# Patient Record
Sex: Male | Born: 1950 | Hispanic: Yes | Marital: Single
Health system: Southern US, Community
[De-identification: ages and names within clinical notes are randomized; demographics above are authoritative.]

## PROBLEM LIST (undated history)

## (undated) DIAGNOSIS — F0390 Unspecified dementia without behavioral disturbance: Secondary | ICD-10-CM

## (undated) DIAGNOSIS — I10 Essential (primary) hypertension: Secondary | ICD-10-CM

---

## 2019-01-06 ENCOUNTER — Emergency Department: Payer: Medicaid Other

## 2019-01-06 ENCOUNTER — Encounter: Payer: Self-pay | Admitting: *Deleted

## 2019-01-06 ENCOUNTER — Inpatient Hospital Stay
Admission: EM | Admit: 2019-01-06 | Discharge: 2019-01-09 | DRG: 641 | Disposition: A | Discharge status: Home Health | Payer: Medicaid Other | Attending: Internal Medicine | Admitting: Internal Medicine

## 2019-01-06 ENCOUNTER — Other Ambulatory Visit: Payer: Self-pay

## 2019-01-06 DIAGNOSIS — E860 Dehydration: Principal | ICD-10-CM | POA: Diagnosis present

## 2019-01-06 DIAGNOSIS — Z681 Body mass index (BMI) 19 or less, adult: Secondary | ICD-10-CM

## 2019-01-06 DIAGNOSIS — R4182 Altered mental status, unspecified: Secondary | ICD-10-CM | POA: Diagnosis present

## 2019-01-06 DIAGNOSIS — F0390 Unspecified dementia without behavioral disturbance: Secondary | ICD-10-CM | POA: Diagnosis present

## 2019-01-06 DIAGNOSIS — E538 Deficiency of other specified B group vitamins: Secondary | ICD-10-CM | POA: Diagnosis present

## 2019-01-06 DIAGNOSIS — E46 Unspecified protein-calorie malnutrition: Secondary | ICD-10-CM | POA: Diagnosis present

## 2019-01-06 DIAGNOSIS — Z1159 Encounter for screening for other viral diseases: Secondary | ICD-10-CM

## 2019-01-06 DIAGNOSIS — K5900 Constipation, unspecified: Secondary | ICD-10-CM | POA: Diagnosis present

## 2019-01-06 DIAGNOSIS — I951 Orthostatic hypotension: Secondary | ICD-10-CM | POA: Diagnosis present

## 2019-01-06 DIAGNOSIS — I10 Essential (primary) hypertension: Secondary | ICD-10-CM | POA: Diagnosis present

## 2019-01-06 DIAGNOSIS — D61818 Other pancytopenia: Secondary | ICD-10-CM | POA: Diagnosis present

## 2019-01-06 DIAGNOSIS — G9340 Encephalopathy, unspecified: Secondary | ICD-10-CM | POA: Diagnosis present

## 2019-01-06 DIAGNOSIS — R319 Hematuria, unspecified: Secondary | ICD-10-CM | POA: Diagnosis present

## 2019-01-06 DIAGNOSIS — E559 Vitamin D deficiency, unspecified: Secondary | ICD-10-CM | POA: Diagnosis present

## 2019-01-06 HISTORY — DX: Essential (primary) hypertension: I10

## 2019-01-06 HISTORY — DX: Unspecified dementia, unspecified severity, without behavioral disturbance, psychotic disturbance, mood disturbance, and anxiety: F03.90

## 2019-01-06 LAB — URINALYSIS, COMPLETE (UACMP) WITH MICROSCOPIC
Bilirubin Urine: NEGATIVE
Glucose, UA: NEGATIVE mg/dL
Ketones, ur: NEGATIVE mg/dL
Leukocytes,Ua: NEGATIVE
Nitrite: NEGATIVE
Protein, ur: 30 mg/dL — AB
Specific Gravity, Urine: 1.01 (ref 1.005–1.030)
Squamous Epithelial / HPF: NONE SEEN (ref 0–5)
pH: 5 (ref 5.0–8.0)

## 2019-01-06 LAB — COMPREHENSIVE METABOLIC PANEL
ALT: 79 U/L — ABNORMAL HIGH (ref 0–44)
AST: 153 U/L — ABNORMAL HIGH (ref 15–41)
Albumin: 2.5 g/dL — ABNORMAL LOW (ref 3.5–5.0)
Alkaline Phosphatase: 181 U/L — ABNORMAL HIGH (ref 38–126)
Anion gap: 7 (ref 5–15)
BUN: 19 mg/dL (ref 8–23)
CO2: 21 mmol/L — ABNORMAL LOW (ref 22–32)
Calcium: 7.7 mg/dL — ABNORMAL LOW (ref 8.9–10.3)
Chloride: 103 mmol/L (ref 98–111)
Creatinine, Ser: 0.94 mg/dL (ref 0.61–1.24)
GFR calc Af Amer: >60 mL/min (ref >60)
GFR calc non Af Amer: >60 mL/min (ref >60)
Glucose, Bld: 100 mg/dL — ABNORMAL HIGH (ref 70–99)
Potassium: 3.9 mmol/L (ref 3.5–5.1)
Sodium: 131 mmol/L — ABNORMAL LOW (ref 135–145)
Total Bilirubin: 0.4 mg/dL (ref 0.3–1.2)
Total Protein: 8.3 g/dL — ABNORMAL HIGH (ref 6.5–8.1)

## 2019-01-06 LAB — CBC
HCT: 29.5 % — ABNORMAL LOW (ref 39.0–52.0)
Hemoglobin: 9 g/dL — ABNORMAL LOW (ref 13.0–17.0)
MCH: 24.9 pg — ABNORMAL LOW (ref 26.0–34.0)
MCHC: 30.5 g/dL (ref 30.0–36.0)
MCV: 81.5 fL (ref 80.0–100.0)
Platelets: 106 10*3/uL — ABNORMAL LOW (ref 150–400)
RBC: 3.62 MIL/uL — ABNORMAL LOW (ref 4.22–5.81)
RDW: 24.2 % — ABNORMAL HIGH (ref 11.5–15.5)
WBC: 2.7 10*3/uL — ABNORMAL LOW (ref 4.0–10.5)
nRBC: 0 % (ref 0.0–0.2)

## 2019-01-06 LAB — AMMONIA: Ammonia: 26 umol/L (ref 9–35)

## 2019-01-06 LAB — LACTIC ACID, PLASMA: Lactic Acid, Venous: 1.5 mmol/L (ref 0.5–1.9)

## 2019-01-06 MED ORDER — SODIUM CHLORIDE 0.9% FLUSH: 3.0000 mL | Freq: Once | INTRAVENOUS | Status: DC

## 2019-01-06 NOTE — ED Notes (Signed)
Interpreter request made. 

## 2019-01-06 NOTE — ED Provider Notes (Signed)
[facility] Emergency Department Provider Note  Time seen: 9:14 PM  I have reviewed the triage vital signs and the nursing notes.   HISTORY  Chief Complaint Altered Mental Status   HPI Ryan Matthews is a 68 y.o. male with a past medical history of dementia, hypertension, presents to the emergency department for altered mental status.  According to EMS report the patient's pastor went to his home and thought he was confused so he called EMS to bring the patient to the emergency department.  Patient lives at home with his brother has a history of dementia.  Here the patient is awake and alert, he is disoriented to time but oriented to person and place.  Denies any complaints.  Denies any pain.  Exam performed with Spanish interpreter present.   Past Medical History:  Diagnosis Date  . Dementia (HCC)   . Hypertension     There are no active problems to display for this patient.    Prior to Admission medications   Not on File    No Known Allergies  No family history on file.  Social History Social History   Tobacco Use  . Smoking status: Not on file  Substance Use Topics  . Alcohol use: Not on file  . Drug use: Not on file    Review of Systems Unable to complete an adequate/accurate review of systems taken to baseline dementia.  ____________________________________________   PHYSICAL EXAM:  VITAL SIGNS: ED Triage Vitals  Enc Vitals Group     BP 01/06/19 2049 (!) 140/92     Pulse Rate 01/06/19 2049 86     Resp 01/06/19 2049 17     Temp 01/06/19 2049 98 F (36.7 C)     Temp Source 01/06/19 2049 Oral     SpO2 01/06/19 2049 100 %     Weight 01/06/19 2050 118 lb (53.5 kg)     Height 01/06/19 2050 5\' 8"  (1.727 m)     Head Circumference --      Peak Flow --      Pain Score 01/06/19 2050 0     Pain Loc --      Pain Edu? --      Excl. in GC? --    Constitutional: Patient is awake and alert, disoriented to time. Eyes: Normal  exam ENT      Head: Normocephalic and atraumatic.      Mouth/Throat: Mucous membranes are moist. Cardiovascular: Normal rate, regular rhythm.  Respiratory: Normal respiratory effort without tachypnea nor retractions. Breath sounds are clear  Gastrointestinal: Soft and nontender. No distention. Musculoskeletal: Nontender with normal range of motion in all extremities.  Neurologic:  No gross focal neurologic deficits  Skin:  Skin is warm, dry and intact.  Psychiatric: Mood and affect are normal.  ____________________________________________    EKG  EKG viewed and interpreted by myself shows a normal sinus rhythm at 85 bpm with a narrow QRS, slight left axis deviation, slight QTC prolongation.  Nonspecific ST changes.  ____________________________________________    RADIOLOGY  Ultrasound pending  ____________________________________________   INITIAL IMPRESSION / ASSESSMENT AND PLAN / ED COURSE  Pertinent labs & imaging results that were available during my care of the patient were reviewed by me and considered in my medical decision making (see chart for details).   Patient presents emergency department for possible altered mental status.  EMS reports possible altered mental status however patient has a reported history of dementia and has no complaints at this  time.  He is able to converse, in Spanish with the interpreter present.  He has no complaints, no concerns.  He states he was at home he ate his dinner and his brother told him he had to go with EMS to get evaluated at the hospital.  I spoke to the patient's brother Ryan Matthews, states the patient does not have baseline dementia although does have confusion at times.  States over the past few weeks he has not been eating as much, has not been sleeping very well at night.  They were concerned so they sent her to the emergency department tonight.  Differential would include infectious etiology, electrolyte abnormality, possibly  dementia as well.    Patient remains confused.  Right upper quadrant ultrasound pending.  Ammonia level pending.  Again it is unclear how much of this is acute versus chronic.  Patient care signed out to oncoming physician.   Ryan Matthews was evaluated in Emergency Department on 01/06/2019 for the symptoms described in the history of present illness. He was evaluated in the context of the global COVID-19 pandemic, which necessitated consideration that the patient might be at risk for infection with the SARS-CoV-2 virus that causes COVID-19. Institutional protocols and algorithms that pertain to the evaluation of patients at risk for COVID-19 are in a state of rapid change based on information released by regulatory bodies including the CDC and federal and state organizations. These policies and algorithms were followed during the patient's care in the ED.  ____________________________________________   FINAL CLINICAL IMPRESSION(S) / ED DIAGNOSES  Altered mental status   Minna Antis, MD 01/06/19 2256

## 2019-01-06 NOTE — ED Notes (Signed)
Pt removed all clothes and monitors and placed socks on his head. Pt was incontinent of a lg amt of urine. Bed changed, warm blankets given. Monitors left off at this time

## 2019-01-06 NOTE — ED Triage Notes (Signed)
Pt lives at home and church pastor called because pt was altered. Pt speaks Spanish unsure if he lives with any family. Pt has lost a lot of weight over past 2 weeks. History very unclear and information provided very inconsistent

## 2019-01-07 ENCOUNTER — Inpatient Hospital Stay: Payer: Medicaid Other

## 2019-01-07 ENCOUNTER — Emergency Department: Payer: Medicaid Other

## 2019-01-07 DIAGNOSIS — R4182 Altered mental status, unspecified: Secondary | ICD-10-CM | POA: Diagnosis present

## 2019-01-07 DIAGNOSIS — K5900 Constipation, unspecified: Secondary | ICD-10-CM | POA: Diagnosis present

## 2019-01-07 DIAGNOSIS — E538 Deficiency of other specified B group vitamins: Secondary | ICD-10-CM | POA: Diagnosis present

## 2019-01-07 DIAGNOSIS — E46 Unspecified protein-calorie malnutrition: Secondary | ICD-10-CM | POA: Diagnosis present

## 2019-01-07 DIAGNOSIS — E860 Dehydration: Secondary | ICD-10-CM | POA: Diagnosis not present

## 2019-01-07 DIAGNOSIS — F0390 Unspecified dementia without behavioral disturbance: Secondary | ICD-10-CM | POA: Diagnosis present

## 2019-01-07 DIAGNOSIS — R319 Hematuria, unspecified: Secondary | ICD-10-CM | POA: Diagnosis present

## 2019-01-07 DIAGNOSIS — Z681 Body mass index (BMI) 19 or less, adult: Secondary | ICD-10-CM | POA: Diagnosis not present

## 2019-01-07 DIAGNOSIS — I10 Essential (primary) hypertension: Secondary | ICD-10-CM | POA: Diagnosis present

## 2019-01-07 DIAGNOSIS — Z1159 Encounter for screening for other viral diseases: Secondary | ICD-10-CM | POA: Diagnosis not present

## 2019-01-07 DIAGNOSIS — G9340 Encephalopathy, unspecified: Secondary | ICD-10-CM | POA: Diagnosis present

## 2019-01-07 DIAGNOSIS — D61818 Other pancytopenia: Secondary | ICD-10-CM | POA: Diagnosis present

## 2019-01-07 DIAGNOSIS — E559 Vitamin D deficiency, unspecified: Secondary | ICD-10-CM | POA: Diagnosis present

## 2019-01-07 DIAGNOSIS — I951 Orthostatic hypotension: Secondary | ICD-10-CM | POA: Diagnosis present

## 2019-01-07 LAB — CBC
HCT: 27.3 % — ABNORMAL LOW (ref 39.0–52.0)
Hemoglobin: 8.2 g/dL — ABNORMAL LOW (ref 13.0–17.0)
MCH: 24.8 pg — ABNORMAL LOW (ref 26.0–34.0)
MCHC: 30 g/dL (ref 30.0–36.0)
MCV: 82.5 fL (ref 80.0–100.0)
Platelets: 100 10*3/uL — ABNORMAL LOW (ref 150–400)
RBC: 3.31 MIL/uL — ABNORMAL LOW (ref 4.22–5.81)
RDW: 24.1 % — ABNORMAL HIGH (ref 11.5–15.5)
WBC: 3 10*3/uL — ABNORMAL LOW (ref 4.0–10.5)
nRBC: 0 % (ref 0.0–0.2)

## 2019-01-07 LAB — VITAMIN B12: Vitamin B-12: 143 pg/mL — ABNORMAL LOW (ref 180–914)

## 2019-01-07 LAB — HEPATIC FUNCTION PANEL
ALT: 70 U/L — ABNORMAL HIGH (ref 0–44)
AST: 140 U/L — ABNORMAL HIGH (ref 15–41)
Albumin: 2.2 g/dL — ABNORMAL LOW (ref 3.5–5.0)
Alkaline Phosphatase: 179 U/L — ABNORMAL HIGH (ref 38–126)
Bilirubin, Direct: 0.2 mg/dL (ref 0.0–0.2)
Indirect Bilirubin: 0.4 mg/dL (ref 0.3–0.9)
Total Bilirubin: 0.6 mg/dL (ref 0.3–1.2)
Total Protein: 7.7 g/dL (ref 6.5–8.1)

## 2019-01-07 LAB — TSH
TSH: 3.276 u[IU]/mL (ref 0.350–4.500)
TSH: 3.14 u[IU]/mL (ref 0.350–4.500)

## 2019-01-07 LAB — PHOSPHORUS: Phosphorus: 4.1 mg/dL (ref 2.5–4.6)

## 2019-01-07 LAB — IRON AND TIBC
Iron: 48 ug/dL (ref 45–182)
Saturation Ratios: 16 % — ABNORMAL LOW (ref 17.9–39.5)
TIBC: 299 ug/dL (ref 250–450)
UIBC: 251 ug/dL

## 2019-01-07 LAB — BASIC METABOLIC PANEL
Anion gap: 7 (ref 5–15)
BUN: 17 mg/dL (ref 8–23)
CO2: 21 mmol/L — ABNORMAL LOW (ref 22–32)
Calcium: 7.7 mg/dL — ABNORMAL LOW (ref 8.9–10.3)
Chloride: 106 mmol/L (ref 98–111)
Creatinine, Ser: 0.9 mg/dL (ref 0.61–1.24)
GFR calc Af Amer: >60 mL/min (ref >60)
GFR calc non Af Amer: >60 mL/min (ref >60)
Glucose, Bld: 86 mg/dL (ref 70–99)
Potassium: 4 mmol/L (ref 3.5–5.1)
Sodium: 134 mmol/L — ABNORMAL LOW (ref 135–145)

## 2019-01-07 LAB — MAGNESIUM: Magnesium: 2.1 mg/dL (ref 1.7–2.4)

## 2019-01-07 LAB — FERRITIN: Ferritin: 124 ng/mL (ref 24–336)

## 2019-01-07 LAB — FOLATE: Folate: 20.9 ng/mL (ref >5.9)

## 2019-01-07 LAB — SARS CORONAVIRUS 2 BY RT PCR (HOSPITAL ORDER, PERFORMED IN ~~LOC~~ HOSPITAL LAB): SARS Coronavirus 2: NEGATIVE

## 2019-01-07 MED ORDER — SODIUM CHLORIDE 0.9 % IV SOLN
INTRAVENOUS | Status: DC
  Administered 2019-01-07: 07:00:00 via INTRAVENOUS

## 2019-01-07 MED ORDER — ENSURE ENLIVE PO LIQD
1.0000 | Freq: Three times a day (TID) | ORAL | Status: DC
  Administered 2019-01-07 – 2019-01-09 (×4): 237 mL via ORAL

## 2019-01-07 MED ORDER — SODIUM CHLORIDE 0.9 % IV SOLN
INTRAVENOUS | Status: DC
  Administered 2019-01-07 – 2019-01-08 (×2): via INTRAVENOUS

## 2019-01-07 MED ORDER — ONDANSETRON HCL 4 MG PO TABS: 4.0000 mg | ORAL_TABLET | Freq: Four times a day (QID) | ORAL | Status: DC | PRN

## 2019-01-07 MED ORDER — IOHEXOL 240 MG/ML SOLN
25.0000 mL | INTRAMUSCULAR | Status: AC
  Administered 2019-01-07 (×2): 25 mL via ORAL

## 2019-01-07 MED ORDER — SODIUM CHLORIDE 0.9% FLUSH
3.0000 mL | Freq: Two times a day (BID) | INTRAVENOUS | Status: DC
  Administered 2019-01-07 – 2019-01-09 (×4): 3 mL via INTRAVENOUS

## 2019-01-07 MED ORDER — CYANOCOBALAMIN 1000 MCG/ML IJ SOLN
1000.0000 ug | Freq: Every day | INTRAMUSCULAR | Status: DC
  Administered 2019-01-07 – 2019-01-09 (×3): 1000 ug via INTRAMUSCULAR
  Filled 2019-01-07 (×3): qty 1

## 2019-01-07 MED ORDER — VITAMIN B-1 100 MG PO TABS
100.0000 mg | ORAL_TABLET | Freq: Every day | ORAL | Status: DC
  Administered 2019-01-07 – 2019-01-09 (×3): 100 mg via ORAL
  Filled 2019-01-07 (×3): qty 1

## 2019-01-07 MED ORDER — FOLIC ACID 1 MG PO TABS
1.0000 mg | ORAL_TABLET | Freq: Every day | ORAL | Status: DC
  Administered 2019-01-07 – 2019-01-09 (×3): 1 mg via ORAL
  Filled 2019-01-07 (×3): qty 1

## 2019-01-07 MED ORDER — IOHEXOL 300 MG/ML  SOLN
75.0000 mL | Freq: Once | INTRAMUSCULAR | Status: AC | PRN
  Administered 2019-01-07: 75 mL via INTRAVENOUS

## 2019-01-07 MED ORDER — ATENOLOL 25 MG PO TABS
12.5000 mg | ORAL_TABLET | Freq: Every day | ORAL | Status: DC
  Administered 2019-01-07: 10:00:00 12.5 mg via ORAL
  Filled 2019-01-07: qty 0.5

## 2019-01-07 MED ORDER — PANTOPRAZOLE SODIUM 40 MG PO TBEC
40.0000 mg | DELAYED_RELEASE_TABLET | Freq: Every day | ORAL | Status: DC
  Administered 2019-01-07 – 2019-01-09 (×3): 40 mg via ORAL
  Filled 2019-01-07 (×3): qty 1

## 2019-01-07 MED ORDER — ONDANSETRON HCL 4 MG/2ML IJ SOLN: 4.0000 mg | Freq: Four times a day (QID) | INTRAMUSCULAR | Status: DC | PRN

## 2019-01-07 MED ORDER — ADULT MULTIVITAMIN W/MINERALS CH
1.0000 | ORAL_TABLET | Freq: Every day | ORAL | Status: DC
  Administered 2019-01-07 – 2019-01-09 (×3): 1 via ORAL
  Filled 2019-01-07 (×3): qty 1

## 2019-01-07 MED ORDER — POLYETHYLENE GLYCOL 3350 17 G PO PACK
17.0000 g | PACK | Freq: Every day | ORAL | Status: DC | PRN
  Administered 2019-01-07: 10:00:00 17 g via ORAL
  Filled 2019-01-07: qty 1

## 2019-01-07 NOTE — Progress Notes (Signed)
Family Meeting Note  Advance Directive:no  Today a meeting took place with the Patient.  Patient is able to participate.  The following clinical team members were present during this meeting:MD  The following were discussed:Patient's diagnosis: altered mental status, Patient's progosis: Unable to determine and Goals for treatment: Full Code  Additional follow-up to be provided: prn  Time spent during discussion:20 minutes  Hilton Sinclair, MD

## 2019-01-07 NOTE — Progress Notes (Signed)
Initial Nutrition Assessment  RD working remotely.  DOCUMENTATION CODES:   Underweight  INTERVENTION:  Agree with regular diet. Patient thought he was not allowed to eat at this time. Discussed he is on a regular diet and can have anything available on the menu. Called kitchen to request Spanish-speaking host/hostess to help patient with ordering.  Provide Ensure Enlive po TID, each supplement provides 350 kcal and 20 grams of protein. Patient prefers vanilla.  Continue daily MVI, thiamine 100 mg daily, folic acid 1 mg daily.  Monitor magnesium, potassium, and phosphorus daily for at least 3 days, MD to replete as needed, as pt is at risk for refeeding syndrome.  Patient would benefit from food-related community resources. Unfortunately the [employer] guide is only available in [place] - spoke with [employer] and they do not have it in [place].  NUTRITION DIAGNOSIS:   Inadequate oral intake related to decreased appetite, other (see comment)(limited access to food at home) as evidenced by per patient/family report.  GOAL:   Patient will meet greater than or equal to 90% of their needs  MONITOR:   PO intake, Supplement acceptance, Labs, Weight trends, I & O's  REASON FOR ASSESSMENT:   Consult Assessment of nutrition requirement/status  ASSESSMENT:   68 year old Spanish-speaking male with PMHx of dementia, HTN admitted with AMS, elevated LFTs, protein calorie malnutrition.   Spoke with patient over the phone with interpreter Orson Slick Laukaitis). Patient is not the best historian. He reports he is not eating well here. He thought he was not allowed to eat yet and didn't realize he was ordered for a diet. Tray is on the way from kitchen. He reports at home he was not eating well because he does not have much food. Unable to provide many more details. Encouraged adequate intake of calories and protein at meals. Patient is amenable to trying vanilla ONS to help  meet calorie/protein needs.  He reports his UBW is 130 lbs. He is unsure when he lost weight. Patient is currently 53.5 kg (118 lbs). No weight history in chart to trend. Patient is underweight.  Medications reviewed and include: folic acid 1 mg daily, MVI daily, pantoprazole, thiamine 100 mg daily, NS @ 75 mL/hr.  Labs reviewed: Sodium 134, CO2 21.  RD suspects patient is malnourished, but unable to confirm without completing NFPE.  NUTRITION - FOCUSED PHYSICAL EXAM:  Unable to complete at this time.  Diet Order:   Diet Order            Diet regular Room service appropriate? Yes; Fluid consistency: Thin  Diet effective now             EDUCATION NEEDS:   No education needs have been identified at this time  Skin:  Skin Assessment: Reviewed RN Assessment  Last BM:  01/06/2019 per chart  Height:   Ht Readings from Last 1 Encounters:  01/06/19 5\' 8"  (1.727 m)   Weight:   Wt Readings from Last 1 Encounters:  01/06/19 53.5 kg   Ideal Body Weight:  70 kg  BMI:  Body mass index is 17.94 kg/m.  Estimated Nutritional Needs:   Kcal:  1600-1800  Protein:  80-90 grams  Fluid:  1.6-1.8 L/day  Helane Rima, MS, RD, LDN Office: [phone] Pager: [phone] After Hours/Weekend Pager: [phone]

## 2019-01-07 NOTE — ED Provider Notes (Addendum)
Patient's labs and x-rays are back.  His ultrasound is negative.  His head CT is negative.  His chest x-ray is negative.  His LFTs are somewhat elevated and he has very mild pancytopenia.  We will get him in the hospital and further evaluate him for his altered mental status.  He is taking all of his clothes off and urinated on the floor.  His brother has told previous ER doctor that this is completely unlike him he is been very confused for couple days.   Arnaldo Natal, MD 01/07/19 0121 Ryan Matthews was evaluated in Emergency Department on 01/07/2019 for the symptoms described in the history of present illness. He was evaluated in the context of the global COVID-19 pandemic, which necessitated consideration that the patient might be at risk for infection with the SARS-CoV-2 virus that causes COVID-19. Institutional protocols and algorithms that pertain to the evaluation of patients at risk for COVID-19 are in a state of rapid change based on information released by regulatory bodies including the CDC and federal and state organizations. These policies and algorithms were followed during the patient's care in the ED.   Arnaldo Natal, MD 01/07/19 [phone]

## 2019-01-07 NOTE — H&P (Signed)
[facility] [place] [facility]   PATIENT NAME: Ryan Matthews    MR#:  [id number]  DATE OF BIRTH:  05/14/1951  DATE OF ADMISSION:  01/06/2019  PRIMARY CARE PHYSICIAN: Patient, No Pcp Per   REQUESTING/REFERRING PHYSICIAN: Minna Antis, MD  CHIEF COMPLAINT:   Chief Complaint  Patient presents with   Altered Mental Status    HISTORY OF PRESENT ILLNESS:  Ryan Matthews  is a 68 y.o. male with a known history of mild dementia and hypertension.  He presented to the emergency room via EMS services from home where he lives with his brother.  According to EMS report, patient's pastor had went to his home for a visit and felt that he was more confused than usual therefore called EMS services for evaluation.  According to the brother's report, patient had been confused over the last 2 days with decreased appetite and decreased p.o. intake.  He is also reported to have difficulty sleeping.  During the time I am examining the patient, he is awake and alert.  He is oriented x4.  He denies shortness of breath or chest pain.  He denies cough.  He denies fevers, chills, nausea, vomiting, diarrhea.  He denies hematemesis, hematochezia, or melena.  He denies joint pain.  He denies dizziness.  He denies unilateral weakness.  Patient reports his p.o. intake has been decreased as he was working night shift recently.  He denies EtOH.  On arrival to the emergency room, CT brain demonstrates no acute intracranial abnormality.  Chest x-ray shows no acute pulmonary disease.  Right upper quadrant ultrasound demonstrates no acute abnormality.  No evidence of cholelithiasis.  However, patient has elevated LFTs with AST 153, ALT 79, and alkaline phosphatase 181.  Patient is also mildly anemic with hemoglobin 9 and hematocrit 29.5 as well as platelet 106.  History of present illness and plan was discussed with patient using interpreter.  He has been admitted to the hospitalist service for  further evaluation and management.  PAST MEDICAL HISTORY:   Past Medical History:  Diagnosis Date   Dementia (HCC)    Hypertension     PAST SURGICAL HISTORY:  None  SOCIAL HISTORY:   Social History   Tobacco Use   Smoking status: Not on file  Substance Use Topics   Alcohol use: Not on file    FAMILY HISTORY:  No family history on file.  DRUG ALLERGIES:  No Known Allergies  REVIEW OF SYSTEMS:   Review of Systems  Constitutional: Negative for chills, fever and weight loss.  HENT: Negative for congestion, sinus pain and sore throat.   Eyes: Negative for blurred vision, double vision and pain.  Respiratory: Negative for cough, hemoptysis, sputum production and shortness of breath.   Cardiovascular: Negative for chest pain, leg swelling and PND.  Gastrointestinal: Negative for abdominal pain, blood in stool, constipation, diarrhea, heartburn, nausea and vomiting.  Genitourinary: Negative for dysuria, flank pain, frequency, hematuria and urgency.  Musculoskeletal: Negative for falls, joint pain, myalgias and neck pain.  Skin: Negative for itching and rash.  Neurological: Negative for dizziness, loss of consciousness and weakness.  Psychiatric/Behavioral: Negative.       MEDICATIONS AT HOME:   Prior to Admission medications   Not on File      VITAL SIGNS:  Blood pressure 132/88, pulse 78, temperature 98 F (36.7 C), temperature source Oral, resp. rate 14, height  (1.727 m), weight 53.5 kg, SpO2 100 %.  PHYSICAL EXAMINATION:  Physical  Exam Vitals signs and nursing note reviewed.  Constitutional:      General: He is not in acute distress. HENT:     Head: Normocephalic.     Right Ear: External ear normal.     Left Ear: External ear normal.     Nose: Nose normal.     Mouth/Throat:     Mouth: Mucous membranes are moist.     Pharynx: Oropharynx is clear.  Eyes:     General: No scleral icterus.    Conjunctiva/sclera: Conjunctivae normal.      Pupils: Pupils are equal, round, and reactive to light.  Neck:     Musculoskeletal: Normal range of motion and neck supple.  Cardiovascular:     Rate and Rhythm: Normal rate and regular rhythm.     Pulses: Normal pulses.     Heart sounds: Normal heart sounds. No murmur. No friction rub. No gallop.   Pulmonary:     Effort: Pulmonary effort is normal. No respiratory distress.     Breath sounds: Normal breath sounds. No wheezing, rhonchi or rales.  Abdominal:     General: Abdomen is flat. Bowel sounds are normal. There is no distension.     Palpations: Abdomen is soft. There is no mass.     Tenderness: There is no abdominal tenderness. There is no guarding or rebound.  Musculoskeletal: Normal range of motion.        General: No swelling or tenderness.     Right lower leg: No edema.     Left lower leg: No edema.  Skin:    General: Skin is warm and dry.     Capillary Refill: Capillary refill takes less than 2 seconds.     Findings: No rash.  Neurological:     Mental Status: He is alert.     Motor: Weakness present.     Comments: Currently oriented x4, disoriented at times       LABORATORY PANEL:   CBC Recent Labs  Lab 01/06/19 2053  WBC 2.7*  HGB 9.0*  HCT 29.5*  PLT 106*   ------------------------------------------------------------------------------------------------------------------  Chemistries  Recent Labs  Lab 01/06/19 2053  NA 131*  K 3.9  CL 103  CO2 21*  GLUCOSE 100*  BUN 19  CREATININE 0.94  CALCIUM 7.7*  AST 153*  ALT 79*  ALKPHOS 181*  BILITOT 0.4   ------------------------------------------------------------------------------------------------------------------  Cardiac Enzymes No results for input(s): TROPONINI in the last 168 hours. ------------------------------------------------------------------------------------------------------------------  RADIOLOGY:  Dg Chest 1 View  Result Date: 01/07/2019 CLINICAL DATA:  Lateral view.  Altered  mental status.  Cough. EXAM: CHEST  1 VIEW COMPARISON:  None. FINDINGS: Examination is limited by single lateral view. There is no acute cardiopulmonary process. No displaced fracture. The heart size is unchanged from prior study. IMPRESSION: No active disease. Electronically Signed   By: Katherine Mantle M.D.   On: 01/07/2019 01:07   Ct Head Wo Contrast  Result Date: 01/06/2019 CLINICAL DATA:  Encephalopathy EXAM: CT HEAD WITHOUT CONTRAST TECHNIQUE: Contiguous axial images were obtained from the base of the skull through the vertex without intravenous contrast. COMPARISON:  None. FINDINGS: Brain: No evidence of acute infarction, hemorrhage, hydrocephalus, extra-axial collection or mass lesion/mass effect. There is age related volume loss and microvascular ischemic changes. Vascular: No hyperdense vessel or unexpected calcification. Skull: Normal. Negative for fracture or focal lesion. Sinuses/Orbits: No acute finding. Other: None. IMPRESSION: No acute intracranial abnormality detected. Electronically Signed   By: Katherine Mantle M.D.   On: 01/06/2019  23:34   Dg Chest Portable 1 View  Result Date: 01/06/2019 CLINICAL DATA:  Altered mental status EXAM: PORTABLE CHEST 1 VIEW COMPARISON:  None. FINDINGS: Cardiac shadows within normal limits. The lungs are well aerated bilaterally. No focal infiltrate or sizable effusion is seen. No acute bony abnormality is noted. IMPRESSION: No active disease. Electronically Signed   By: Alcide CleverMark  Lukens M.D.   On: 01/06/2019 21:20   [place] Abdomen Limited Ruq  Result Date: 01/07/2019 CLINICAL DATA:  Elevated LFTs EXAM: ULTRASOUND ABDOMEN LIMITED RIGHT UPPER QUADRANT COMPARISON:  None. FINDINGS: Gallbladder: No gallstones or wall thickening visualized. No sonographic Murphy sign noted by sonographer. 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 Common bile duct: Diameter: 0.2 cm Liver: No focal lesion identified. Within normal limits in parenchymal echogenicity. Portal vein is  patent on color Doppler imaging with normal direction of blood flow towards the liver. IMPRESSION: No acute sonographic abnormality detected. No evidence for cholelithiasis. Electronically Signed   By: Katherine Mantlehristopher  Green M.D.   On: 01/07/2019 00:36      IMPRESSION AND PLAN:   1.  Altered mental status - Possible history of dementia - We will continue every 4 hours neurologic checks - No evidence of TIA or CVA, no unilateral weakness, slurred speech, facial drooping  2. elevated LFTs - Right upper quadrant ultrasound is negative with no acute findings - Patient denies EtOH - Repeat LFTs in the a.m. - Patient may need gastroenterology consult with further evaluation  3.  Protein calorie malnutrition - Dietitian consulted for nutrition recommendations - Vitamin supplementation - Physical therapy consulted for supportive care  4.  Hypertension - Patient tells me he takes atenolol daily.  Will restart this. - We will treat persistent hypertension expectantly  DVT and PPI prophylaxis were initiated      All the records are reviewed and case discussed with ED provider. The plan of care was discussed in details with the patient (and family). I answered all questions. The patient agreed to proceed with the above mentioned plan. Further management will depend upon hospital course.   CODE STATUS: Full code  TOTAL TIME TAKING CARE OF THIS PATIENT:45 minutes.    Milas Kocherngela H Adolfo Granieri CRNP on 01/07/2019 at 2:56 AM  Pager - [phone]  After 6pm go to [email or web address] - Social research officer, governmentpassword EPAS [email or web address]  [facility] [place] [facility]  Office  [phone]  CC: Primary care physician; Patient, No Pcp Per   Note: This dictation was prepared with Dragon dictation along with smaller phrase technology. Any transcriptional errors that result from this process are unintentional.

## 2019-01-07 NOTE — ED Notes (Signed)
ED TO INPATIENT HANDOFF REPORT  ED Nurse Name and Phone #:  Madelon Lips Name/Age/Gender Lubertha Sayres 68 y.o. male Room/Bed: ED24A/ED24A  Code Status   Code Status: Full Code  Home/SNF/Other Home Patient oriented to: self, place and situation Is this baseline? Yes   Triage Complete: Triage complete  Chief Complaint alt mental status  Triage Note Pt lives at home and church pastor called because pt was altered. Pt speaks Spanish unsure if he lives with any family. Pt has lost a lot of weight over past 2 weeks. History very unclear and information provided very inconsistent   Allergies No Known Allergies  Level of Care/Admitting Diagnosis ED Disposition    ED Disposition Condition Comment   Admit  Hospital Area: [facility] [[id number]]  Level of Care: Med-Surg [16]  Covid Evaluation: N/A  Diagnosis: Altered mental status [780.97.ICD-9-CM]  Admitting Physician: Willadean Carol DODD [[id number]]  Attending Physician: Willadean Carol DODD [[id number]]  Estimated length of stay: past midnight tomorrow  Certification:: I certify this patient will need inpatient services for at least 2 midnights  PT Class (Do Not Modify): Inpatient [101]  PT Acc Code (Do Not Modify): Private [1]       B Medical/Surgery History Past Medical History:  Diagnosis Date  . Dementia (HCC)   . Hypertension       A IV Location/Drains/Wounds Patient Lines/Drains/Airways Status   Active Line/Drains/Airways    Name:   Placement date:   Placement time:   Site:   Days:   Peripheral IV 01/06/19 Left Forearm   01/06/19    2100    Forearm   1          Intake/Output Last 24 hours No intake or output data in the 24 hours ending 01/07/19 2130  Labs/Imaging Results for orders placed or performed during the hospital encounter of 01/06/19 (from the past 48 hour(s))  Comprehensive metabolic panel     Status: Abnormal   Collection Time: 01/06/19  8:53 PM  Result Value Ref Range   Sodium 131  (L) 135 - 145 mmol/L   Potassium 3.9 3.5 - 5.1 mmol/L   Chloride 103 98 - 111 mmol/L   CO2 21 (L) 22 - 32 mmol/L   Glucose, Bld 100 (H) 70 - 99 mg/dL   BUN 19 8 - 23 mg/dL   Creatinine, Ser 8.65 0.61 - 1.24 mg/dL   Calcium 7.7 (L) 8.9 - 10.3 mg/dL   Total Protein 8.3 (H) 6.5 - 8.1 g/dL   Albumin 2.5 (L) 3.5 - 5.0 g/dL   AST 784 (H) 15 - 41 U/L   ALT 79 (H) 0 - 44 U/L   Alkaline Phosphatase 181 (H) 38 - 126 U/L   Total Bilirubin 0.4 0.3 - 1.2 mg/dL   GFR calc non Af Amer >60 >60 mL/min   GFR calc Af Amer >60 >60 mL/min   Anion gap 7 5 - 15    Comment: Performed at [facility], [address] [place], [place] [address]  CBC     Status: Abnormal   Collection Time: 01/06/19  8:53 PM  Result Value Ref Range   WBC 2.7 (L) 4.0 - 10.5 K/uL   RBC 3.62 (L) 4.22 - 5.81 MIL/uL   Hemoglobin 9.0 (L) 13.0 - 17.0 g/dL   HCT 52.8 (L) 41.3 - 24.4 %   MCV 81.5 80.0 - 100.0 fL   MCH 24.9 (L) 26.0 - 34.0 pg   MCHC 30.5 30.0 - 36.0  g/dL   RDW 08.6 (H) 76.1 - 95.0 %   Platelets 106 (L) 150 - 400 K/uL    Comment: Immature Platelet Fraction may be clinically indicated, consider ordering this additional test [id number]    nRBC 0.0 0.0 - 0.2 %    Comment: Performed at [facility], [address] [place], [place] [address]  Urinalysis, Complete w Microscopic     Status: Abnormal   Collection Time: 01/06/19  8:53 PM  Result Value Ref Range   Color, Urine YELLOW (A) YELLOW   APPearance HAZY (A) CLEAR   Specific Gravity, Urine 1.010 1.005 - 1.030   pH 5.0 5.0 - 8.0   Glucose, UA NEGATIVE NEGATIVE mg/dL   Hgb urine dipstick MODERATE (A) NEGATIVE   Bilirubin Urine NEGATIVE NEGATIVE   Ketones, ur NEGATIVE NEGATIVE mg/dL   Protein, ur 30 (A) NEGATIVE mg/dL   Nitrite NEGATIVE NEGATIVE   Leukocytes,Ua NEGATIVE NEGATIVE   RBC / HPF 0-5 0 - 5 RBC/hpf   WBC, UA 0-5 0 - 5 WBC/hpf   Bacteria, UA RARE (A) NONE SEEN   Squamous Epithelial / LPF NONE SEEN 0 - 5   Mucus PRESENT     Hyaline Casts, UA PRESENT    Granular Casts, UA PRESENT     Comment: Performed at [facility], [address] [place], [place] [address]  Ammonia     Status: None   Collection Time: 01/06/19  8:53 PM  Result Value Ref Range   Ammonia 26 9 - 35 umol/L    Comment: HEMOLYSIS AT THIS LEVEL MAY AFFECT RESULT Performed at [facility], [address] [place], [place] [address]   Lactic acid, plasma     Status: None   Collection Time: 01/06/19  9:02 PM  Result Value Ref Range   Lactic Acid, Venous 1.5 0.5 - 1.9 mmol/L    Comment: Performed at [facility], [address] [place], [place] [address]  TSH     Status: None   Collection Time: 01/07/19  1:19 AM  Result Value Ref Range   TSH 3.276 0.350 - 4.500 uIU/mL    Comment: Performed by a 3rd Generation assay with a functional sensitivity of <=0.01 uIU/mL. Performed at [facility], [address], [place], [place] [address]    Dg Chest 1 View  Result Date: 01/07/2019 CLINICAL DATA:  Lateral view.  Altered mental status.  Cough. EXAM: CHEST  1 VIEW COMPARISON:  None. FINDINGS: Examination is limited by single lateral view. There is no acute cardiopulmonary process. No displaced fracture. The heart size is unchanged from prior study. IMPRESSION: No active disease. Electronically Signed   By: Katherine Mantle M.D.   On: 01/07/2019 01:07   Ct Head Wo Contrast  Result Date: 01/06/2019 CLINICAL DATA:  Encephalopathy EXAM: CT HEAD WITHOUT CONTRAST TECHNIQUE: Contiguous axial images were obtained from the base of the skull through the vertex without intravenous contrast. COMPARISON:  None. FINDINGS: Brain: No evidence of acute infarction, hemorrhage, hydrocephalus, extra-axial collection or mass lesion/mass effect. There is age related volume loss and microvascular ischemic changes. Vascular: No hyperdense vessel or unexpected calcification. Skull: Normal. Negative for fracture or focal lesion.  Sinuses/Orbits: No acute finding. Other: None. IMPRESSION: No acute intracranial abnormality detected. Electronically Signed   By: Katherine Mantle M.D.   On: 01/06/2019 23:34   Dg Chest Portable 1 View  Result Date: 01/06/2019 CLINICAL DATA:  Altered mental status EXAM: PORTABLE CHEST 1 VIEW COMPARISON:  None. FINDINGS:  Cardiac shadows within normal limits. The lungs are well aerated bilaterally. No focal infiltrate or sizable effusion is seen. No acute bony abnormality is noted. IMPRESSION: No active disease. Electronically Signed   By: Alcide Clever M.D.   On: 01/06/2019 21:20   US Abdomen Limited Ruq  Result Date: 01/07/2019 CLINICAL DATA:  Elevated LFTs EXAM: ULTRASOUND ABDOMEN LIMITED RIGHT UPPER QUADRANT COMPARISON:  None. FINDINGS: Gallbladder: No gallstones or wall thickening visualized. No sonographic Murphy sign noted by sonographer. 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 Common bile duct: Diameter: 0.2 cm Liver: No focal lesion identified. Within normal limits in parenchymal echogenicity. Portal vein is patent on color Doppler imaging with normal direction of blood flow towards the liver. IMPRESSION: No acute sonographic abnormality detected. No evidence for cholelithiasis. Electronically Signed   By: Katherine Mantle M.D.   On: 01/07/2019 00:36    Pending Labs Unresulted Labs (From admission, onward)    Start     Ordered   01/07/19 0500  CBC  Tomorrow morning,   STAT     01/07/19 0317   01/07/19 0500  Basic metabolic panel  Tomorrow morning,   STAT     01/07/19 0317   01/07/19 0318  HIV antibody (Routine Testing)  Once,   STAT     01/07/19 0317   01/07/19 0318  TSH  Once,   STAT     01/07/19 0317   01/07/19 0318  Urine culture  Once,   STAT     01/07/19 0317   01/07/19 0318  Hepatic function panel  Once,   STAT     01/07/19 0317   01/07/19 0132  SARS Coronavirus 2 (CEPHEID - Performed in [facility] hospital lab), [facility] Order  (Asymptomatic Patients Labs)  Once,   STAT     Question:  Rule Out  Answer:  Yes   01/07/19 0131   01/07/19 0120  Folate RBC  ONCE - STAT,   STAT     01/07/19 0119   01/07/19 0119  Vitamin B12  ONCE - STAT,   STAT     01/07/19 0119          Vitals/Pain Today's Vitals   01/06/19 2130 01/06/19 2245 01/06/19 2248 01/06/19 2300  BP: 124/84   132/88  Pulse:  89  78  Resp:    14  Temp:      TempSrc:      SpO2:  100%  100%  Weight:      Height:      PainSc:   0-No pain 0-No pain    Isolation Precautions No active isolations  Medications Medications  sodium chloride flush (NS) 0.9 % injection 3 mL (has no administration in time range)  0.9 %  sodium chloride infusion (has no administration in time range)  polyethylene glycol (MIRALAX / GLYCOLAX) packet 17 g (has no administration in time range)  ondansetron (ZOFRAN) tablet 4 mg (has no administration in time range)    Or  ondansetron (ZOFRAN) injection 4 mg (has no administration in time range)  folic acid (FOLVITE) tablet 1 mg (has no administration in time range)  multivitamin with minerals tablet 1 tablet (has no administration in time range)  thiamine (VITAMIN B-1) tablet 100 mg (has no administration in time range)  pantoprazole (PROTONIX) EC tablet 40 mg (has no administration in time range)  atenolol (TENORMIN) tablet 12.5 mg (has no administration in time range)    Mobility walks with device High fall risk   Focused Assessments Neuro  Assessment Handoff:  Swallow screen pass? no code stroke sx         Neuro Assessment:   Neuro Checks:      Last Documented NIHSS Modified Score:   Has TPA been given? No If patient is a Neuro Trauma and patient is going to OR before floor call report to 4N Charge nurse: [phone] or [phone]     R Recommendations: See Admitting Provider Note  Report given to:   Additional Notes:  Spanish speaking

## 2019-01-07 NOTE — Evaluation (Signed)
Physical Therapy Evaluation Patient Details Name: Ryan SayresSergio Matthews MRN: [id number] DOB: 05/14/1951 Today's Date: 01/07/2019   History of Present Illness  Per MD H&P: Pt is a 68 y.o. male with a known history of mild dementia and hypertension.  He presented to the emergency room via EMS services from home where he lives with his brother.  According to EMS report, patient's pastor had went to his home for a visit and felt that he was more confused than usual therefore called EMS services for evaluation.  According to the brother's report, patient had been confused over the last 2 days with decreased appetite and decreased p.o. intake.  He also reported difficulty sleeping.   Patient reported his p.o. intake has been decreased as he was working night shift recently.  CT of brain demonstrated no acute intracranial abnormality.  Chest x-ray showed no acute pulmonary disease.  Right upper quadrant ultrasound demonstrated no acute abnormality.  No evidence of cholelithiasis.  However, patient had elevated LFTs with AST 153, ALT 79, and alkaline phosphatase 181.  Patient was also mildly anemic.    Clinical Impression  Spanish interpreter Ryan HaringLoyda Matthews assisted during the session.  Pt presents with mild deficits in strength, transfers, mobility, gait, balance, and activity tolerance.  Pt required extra time and effort with bed mobility tasks but no physical assistance.  Pt was able to stand and sit with some effort but without assist.  Pt was able to amb 7080' with a RW and CGA with min instability/drifting and reported feeling that he required use of a RW at this time even though he does not use one at baseline.  Overall pt demonstrates a loss of functional strength and activity tolerance compared to his stated baseline and will benefit from HHPT services upon discharge to safely address above deficits for decreased caregiver assistance and eventual return to PLOF.      Follow Up Recommendations Home health  PT;Supervision for mobility/OOB    Equipment Recommendations  Rolling walker with 5" wheels    Recommendations for Other Services       Precautions / Restrictions Precautions Precautions: Fall Restrictions Weight Bearing Restrictions: No      Mobility  Bed Mobility Overal bed mobility: Modified Independent             General bed mobility comments: Extra time and effort required for bed mobility tasks   Transfers Overall transfer level: Needs assistance Equipment used: Rolling walker (2 wheeled) Transfers: Sit to/from Stand Sit to Stand: Min guard         General transfer comment: Fair eccentric and concentric control during transfers  Ambulation/Gait Ambulation/Gait assistance: Min guard Gait Distance (Feet): 80 Feet Assistive device: Rolling walker (2 wheeled) Gait Pattern/deviations: Step-through pattern;Decreased step length - right;Decreased step length - left;Drifts right/left Gait velocity: decreased   General Gait Details: Min instability/drifting during amb but pt able to self correct without assistance; pt reported feeling safer with the RW during amb  Stairs            Wheelchair Mobility    Modified Rankin (Stroke Patients Only)       Balance Overall balance assessment: Mild deficits observed, not formally tested                                           Pertinent Vitals/Pain Pain Assessment: No/denies pain    Home Living Family/patient  expects to be discharged to:: Private residence(History obtained with assistance from spanish interpreter Ryan Matthews) Living Arrangements: Other relatives(Brother) Available Help at Discharge: Family;Available 24 hours/day Type of Home: House Home Access: Stairs to enter Entrance Stairs-Rails: Right;Left;Can reach both Entrance Stairs-Number of Steps: 5 Home Layout: One level Home Equipment: None      Prior Function Level of Independence: Independent         Comments:  Pt Ind with amb limited community distances with no fall history, Ind with ADLs     Hand Dominance        Extremity/Trunk Assessment   Upper Extremity Assessment Upper Extremity Assessment: Generalized weakness    Lower Extremity Assessment Lower Extremity Assessment: Generalized weakness       Communication   Communication: Interpreter utilized  Cognition Arousal/Alertness: Awake/alert Behavior During Therapy: WFL for tasks assessed/performed Overall Cognitive Status: Within Functional Limits for tasks assessed                                        General Comments      Exercises     Assessment/Plan    PT Assessment Patient needs continued PT services  PT Problem List Decreased strength;Decreased balance;Decreased activity tolerance       PT Treatment Interventions DME instruction;Gait training;Stair training;Functional mobility training;Therapeutic activities;Therapeutic exercise;Balance training;Patient/family education    PT Goals (Current goals can be found in the Care Plan section)  Acute Rehab PT Goals Patient Stated Goal: To get stronger PT Goal Formulation: With patient Time For Goal Achievement: 01/20/19 Potential to Achieve Goals: Good    Frequency Min 2X/week   Barriers to discharge        Co-evaluation               AM-PAC PT "6 Clicks" Mobility  Outcome Measure Help needed turning from your back to your side while in a flat bed without using bedrails?: A Little Help needed moving from lying on your back to sitting on the side of a flat bed without using bedrails?: A Little Help needed moving to and from a bed to a chair (including a wheelchair)?: A Little Help needed standing up from a chair using your arms (e.g., wheelchair or bedside chair)?: A Little Help needed to walk in hospital room?: A Little Help needed climbing 3-5 steps with a railing? : A Little 6 Click Score: 18    End of Session Equipment Utilized  During Treatment: Gait belt Activity Tolerance: Patient tolerated treatment well Patient left: in bed;with bed alarm set;with call bell/phone within reach;Other (comment)(MD at bedside at end of session) Nurse Communication: Mobility status PT Visit Diagnosis: Unsteadiness on feet (R26.81);Muscle weakness (generalized) (M62.81);Difficulty in walking, not elsewhere classified (R26.2)    Time: 3016-0109 PT Time Calculation (min) (ACUTE ONLY): 27 min   Charges:   PT Evaluation $PT Eval Low Complexity: 1 Low          D. Scott Bemnet Trovato PT, DPT 01/07/19, 11:21 AM

## 2019-01-07 NOTE — Progress Notes (Signed)
Patient ID: Ryan Matthews, male   DOB: 05/14/1951, 68 y.o.   MRN: [id number]  [facility] PROGRESS NOTE  Ryan Matthews [id number]:[id number] DOB: 05/14/1951 DOA: 01/06/2019 PCP: Patient, No Pcp Per  HPI/Subjective: Patient does not know why he is here.  He states he feels okay.  Spoke with brother on the phone states that patient has been getting dizzy and not eating very much and he is forgetting to take his medications.  Objective: Vitals:   01/07/19 1049 01/07/19 1051  BP: (!) 86/68 121/83  Pulse: (!) 111 82  Resp: 20 20  Temp: 98.1 F (36.7 C) 98.1 F (36.7 C)  SpO2: 100% 100%    Intake/Output Summary (Last 24 hours) at 01/07/2019 1535 Last data filed at 01/07/2019 1500 Gross per 24 hour  Intake 1212 ml  Output -  Net 1212 ml   Filed Weights   01/06/19 2050  Weight: 53.5 kg    ROS: Review of Systems  Constitutional: Negative for chills and fever.  Eyes: Negative for blurred vision.  Respiratory: Negative for cough and shortness of breath.   Cardiovascular: Negative for chest pain.  Gastrointestinal: Negative for abdominal pain, constipation, diarrhea, nausea and vomiting.  Genitourinary: Negative for dysuria.  Musculoskeletal: Negative for joint pain.  Neurological: Negative for dizziness and headaches.   Exam: Physical Exam  Constitutional: He is oriented to person, place, and time.  HENT:  Nose: No mucosal edema.  Mouth/Throat: No oropharyngeal exudate or posterior oropharyngeal edema.  Eyes: Pupils are equal, round, and reactive to light. Conjunctivae, EOM and lids are normal.  Neck: No JVD present. Carotid bruit is not present. No edema present. No thyroid mass and no thyromegaly present.  Cardiovascular: S1 normal and S2 normal. Exam reveals no gallop.  No murmur heard. Pulses:      Dorsalis pedis pulses are 2+ on the right side and 2+ on the left side.  Respiratory: No respiratory distress. He has no wheezes. He has no rhonchi. He has no rales.  GI:  Soft. Bowel sounds are normal. There is no abdominal tenderness.  Musculoskeletal:     Right ankle: He exhibits no swelling.     Left ankle: He exhibits no swelling.  Lymphadenopathy:    He has no cervical adenopathy.  Neurological: He is alert and oriented to person, place, and time. No cranial nerve deficit.  Skin: Skin is warm. Nails show no clubbing.  Chronic lower extremity skin discoloration.  Looks like a large mole on the left arm.  Psychiatric: He has a normal mood and affect.      Data Reviewed: Basic Metabolic Panel: Recent Labs  Lab 01/06/19 2053 01/07/19 0443 01/07/19 0615  NA 131* 134*  --   K 3.9 4.0  --   CL 103 106  --   CO2 21* 21*  --   GLUCOSE 100* 86  --   BUN 19 17  --   CREATININE 0.94 0.90  --   CALCIUM 7.7* 7.7*  --   MG  --   --  2.1  PHOS  --   --  4.1   Liver Function Tests: Recent Labs  Lab 01/06/19 2053 01/07/19 0443  AST 153* 140*  ALT 79* 70*  ALKPHOS 181* 179*  BILITOT 0.4 0.6  PROT 8.3* 7.7  ALBUMIN 2.5* 2.2*    Recent Labs  Lab 01/06/19 2053  AMMONIA 26   CBC: Recent Labs  Lab 01/06/19 2053 01/07/19 0443  WBC 2.7* 3.0*  HGB 9.0* 8.2*  HCT 29.5* 27.3*  MCV 81.5 82.5  PLT 106* 100*     Recent Results (from the past 240 hour(s))  SARS Coronavirus 2 (CEPHEID - Performed in [facility] lab), Hosp Order     Status: None   Collection Time: 01/07/19  2:17 AM  Result Value Ref Range Status   SARS Coronavirus 2 NEGATIVE NEGATIVE Final    Comment: (NOTE) If result is NEGATIVE SARS-CoV-2 target nucleic acids are NOT DETECTED. The SARS-CoV-2 RNA is generally detectable in upper and lower  respiratory specimens during the acute phase of infection. The lowest  concentration of SARS-CoV-2 viral copies this assay can detect is 250  copies / mL. A negative result does not preclude SARS-CoV-2 infection  and should not be used as the sole basis for treatment or other  patient management decisions.  A negative result  may occur with  improper specimen collection / handling, submission of specimen other  than nasopharyngeal swab, presence of viral mutation(s) within the  areas targeted by this assay, and inadequate number of viral copies  (<250 copies / mL). A negative result must be combined with clinical  observations, patient history, and epidemiological information. If result is POSITIVE SARS-CoV-2 target nucleic acids are DETECTED. The SARS-CoV-2 RNA is generally detectable in upper and lower  respiratory specimens dur ing the acute phase of infection.  Positive  results are indicative of active infection with SARS-CoV-2.  Clinical  correlation with patient history and other diagnostic information is  necessary to determine patient infection status.  Positive results do  not rule out bacterial infection or co-infection with other viruses. If result is PRESUMPTIVE POSTIVE SARS-CoV-2 nucleic acids MAY BE PRESENT.   A presumptive positive result was obtained on the submitted specimen  and confirmed on repeat testing.  While 2019 novel coronavirus  (SARS-CoV-2) nucleic acids may be present in the submitted sample  additional confirmatory testing may be necessary for epidemiological  and / or clinical management purposes  to differentiate between  SARS-CoV-2 and other Sarbecovirus currently known to infect humans.  If clinically indicated additional testing with an alternate test  methodology [phone]) is advised. The SARS-CoV-2 RNA is generally  detectable in upper and lower respiratory sp ecimens during the acute  phase of infection. The expected result is Negative. Fact Sheet for Patients:  BoilerBrush.com.cyhttps://www.fda.gov/media/136312/download Fact Sheet for Healthcare Providers: https://pope.com/https://www.fda.gov/media/136313/download This test is not yet approved or cleared by the [place] FDA and has been authorized for detection and/or diagnosis of SARS-CoV-2 by FDA under an Emergency Use Authorization (EUA).   This EUA will remain in effect (meaning this test can be used) for the duration of the COVID-19 declaration under Section 564(b)(1) of the Act, 21 U.S.C. section 360bbb-3(b)(1), unless the authorization is terminated or revoked sooner. Performed at [facility], [address] [place], [place] [id number]      Studies: Dg Chest 1 View  Result Date: 01/07/2019 CLINICAL DATA:  Lateral view.  Altered mental status.  Cough. EXAM: CHEST  1 VIEW COMPARISON:  None. FINDINGS: Examination is limited by single lateral view. There is no acute cardiopulmonary process. No displaced fracture. The heart size is unchanged from prior study. IMPRESSION: No active disease. Electronically Signed   By: Katherine Mantlehristopher  Green M.D.   On: 01/07/2019 01:07   Ct Head Wo Contrast  Result Date: 01/06/2019 CLINICAL DATA:  Encephalopathy EXAM: CT HEAD WITHOUT CONTRAST TECHNIQUE: Contiguous axial images were obtained from the base of the skull through the vertex without intravenous  contrast. COMPARISON:  None. FINDINGS: Brain: No evidence of acute infarction, hemorrhage, hydrocephalus, extra-axial collection or mass lesion/mass effect. There is age related volume loss and microvascular ischemic changes. Vascular: No hyperdense vessel or unexpected calcification. Skull: Normal. Negative for fracture or focal lesion. Sinuses/Orbits: No acute finding. Other: None. IMPRESSION: No acute intracranial abnormality detected. Electronically Signed   By: Katherine Mantle M.D.   On: 01/06/2019 23:34   Dg Chest Portable 1 View  Result Date: 01/06/2019 CLINICAL DATA:  Altered mental status EXAM: PORTABLE CHEST 1 VIEW COMPARISON:  None. FINDINGS: Cardiac shadows within normal limits. The lungs are well aerated bilaterally. No focal infiltrate or sizable effusion is seen. No acute bony abnormality is noted. IMPRESSION: No active disease. Electronically Signed   By: Alcide Clever M.D.   On: 01/06/2019 21:20   US Abdomen Limited  Ruq  Result Date: 01/07/2019 CLINICAL DATA:  Elevated LFTs EXAM: ULTRASOUND ABDOMEN LIMITED RIGHT UPPER QUADRANT COMPARISON:  None. FINDINGS: Gallbladder: No gallstones or wall thickening visualized. No sonographic Murphy sign noted by sonographer. 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 Common bile duct: Diameter: 0.2 cm Liver: No focal lesion identified. Within normal limits in parenchymal echogenicity. Portal vein is patent on color Doppler imaging with normal direction of blood flow towards the liver. IMPRESSION: No acute sonographic abnormality detected. No evidence for cholelithiasis. Electronically Signed   By: Katherine Mantle M.D.   On: 01/07/2019 00:36    Scheduled Meds: . cyanocobalamin  1,000 mcg Intramuscular Daily  . feeding supplement (ENSURE ENLIVE)  1 Bottle Oral TID BM  . folic acid  1 mg Oral Daily  . multivitamin with minerals  1 tablet Oral Daily  . pantoprazole  40 mg Oral Daily  . sodium chloride flush  3 mL Intravenous Q12H  . thiamine  100 mg Oral Daily   Continuous Infusions: . sodium chloride 50 mL/hr at 01/07/19 1200    Assessment/Plan:  1. Acute encephalopathy.  This has improved.  Patient answering questions through interpreter. 2. Pancytopenia.  Likely will need follow-up with hematology as outpatient.  Guaiac stools for his anemia.  If hemoglobin drops down further may end up needing a blood transfusion. 3. B12 deficiency.  Will give IM B12 shots while here in the hospital and oral B12 upon getting out of the hospital.  Will need probably B12 injections on a monthly basis. 4. Elevated liver function test.  We will get a CT scan of the abdomen. 5. Protein calorie malnutrition. 6. Orthostatic hypotension.  Hold atenolol and give IV fluids.  Code Status:     Code Status Orders  (From admission, onward)         Start     Ordered   01/07/19 0318  Full code  Continuous     01/07/19 0317        Code Status History    This patient has a current code  status but no historical code status.     Family Communication: Spoke with brother on the phone Disposition Plan: To be determined based on clinical course  Time spent: 36 minutes.  Patient seen and examined with translator and along with physical therapy and nursing staff.  Spoke with brother on the phone.  Tateanna Bach [employer]

## 2019-01-08 LAB — CBC WITH DIFFERENTIAL/PLATELET
Abs Immature Granulocytes: 0.02 10*3/uL (ref 0.00–0.07)
Basophils Absolute: 0 10*3/uL (ref 0.0–0.1)
Basophils Relative: 0 %
Eosinophils Absolute: 0 10*3/uL (ref 0.0–0.5)
Eosinophils Relative: 0 %
HCT: 24.2 % — ABNORMAL LOW (ref 39.0–52.0)
Hemoglobin: 7.2 g/dL — ABNORMAL LOW (ref 13.0–17.0)
Immature Granulocytes: 1 %
Lymphocytes Relative: 17 %
Lymphs Abs: 0.6 10*3/uL — ABNORMAL LOW (ref 0.7–4.0)
MCH: 25 pg — ABNORMAL LOW (ref 26.0–34.0)
MCHC: 29.8 g/dL — ABNORMAL LOW (ref 30.0–36.0)
MCV: 84 fL (ref 80.0–100.0)
Monocytes Absolute: 0.3 10*3/uL (ref 0.1–1.0)
Monocytes Relative: 8 %
Neutro Abs: 2.4 10*3/uL (ref 1.7–7.7)
Neutrophils Relative %: 74 %
Platelets: 92 10*3/uL — ABNORMAL LOW (ref 150–400)
RBC: 2.88 MIL/uL — ABNORMAL LOW (ref 4.22–5.81)
RDW: 24.4 % — ABNORMAL HIGH (ref 11.5–15.5)
Smear Review: NORMAL
WBC: 3.3 10*3/uL — ABNORMAL LOW (ref 4.0–10.5)
nRBC: 0 % (ref 0.0–0.2)

## 2019-01-08 LAB — RETICULOCYTES
Immature Retic Fract: 21.1 % — ABNORMAL HIGH (ref 2.3–15.9)
RBC.: 2.95 MIL/uL — ABNORMAL LOW (ref 4.22–5.81)
Retic Count, Absolute: 20.9 10*3/uL (ref 19.0–186.0)
Retic Ct Pct: 0.7 % (ref 0.4–3.1)

## 2019-01-08 LAB — HEPATITIS PANEL, ACUTE
HCV Ab: <0.1 s/co ratio (ref 0.0–0.9)
Hep A IgM: NEGATIVE
Hep B C IgM: NEGATIVE
Hepatitis B Surface Ag: NEGATIVE

## 2019-01-08 LAB — URINE CULTURE: Culture: NO GROWTH

## 2019-01-08 LAB — CK: Total CK: 94 U/L (ref 49–397)

## 2019-01-08 LAB — OCCULT BLOOD X 1 CARD TO LAB, STOOL: Fecal Occult Bld: POSITIVE — AB

## 2019-01-08 LAB — FOLATE RBC
Folate, Hemolysate: 309 ng/mL
Folate, RBC: 1221 ng/mL (ref >498)
Hematocrit: 25.3 % — ABNORMAL LOW (ref 37.5–51.0)

## 2019-01-08 LAB — PROTIME-INR
INR: 1.2 (ref 0.8–1.2)
Prothrombin Time: 14.6 seconds (ref 11.4–15.2)

## 2019-01-08 LAB — HIV ANTIBODY (ROUTINE TESTING W REFLEX): HIV Screen 4th Generation wRfx: NONREACTIVE

## 2019-01-08 LAB — PATHOLOGIST SMEAR REVIEW

## 2019-01-08 LAB — GAMMA GT: GGT: 302 U/L — ABNORMAL HIGH (ref 7–50)

## 2019-01-08 NOTE — Progress Notes (Signed)
Patient ID: Ryan Matthews, male   DOB: 05/14/1951, 68 y.o.   MRN: [id number]  [facility] PROGRESS NOTE  Ryan Matthews [id number]:[id number] DOB: 05/14/1951 DOA: 01/06/2019 PCP: Patient, No Pcp Per  HPI/Subjective: Patient feels okay.  Still feels little weak.  Still little unsteady.  Had bowel movements after CAT scan yesterday.  Objective: Vitals:   01/08/19 0559 01/08/19 1000  BP:  116/77  Pulse: 72 83  Resp:  18  Temp:  97.6 F (36.4 C)  SpO2:  100%    Filed Weights   01/06/19 2050  Weight: 53.5 kg    ROS: Review of Systems  Constitutional: Positive for malaise/fatigue. Negative for chills and fever.  Eyes: Negative for blurred vision.  Respiratory: Negative for cough and shortness of breath.   Cardiovascular: Negative for chest pain.  Gastrointestinal: Negative for abdominal pain, constipation, diarrhea, nausea and vomiting.  Genitourinary: Negative for dysuria.  Musculoskeletal: Negative for joint pain.  Neurological: Negative for dizziness and headaches.   Exam: Physical Exam  Constitutional: He is oriented to person, place, and time.  HENT:  Nose: No mucosal edema.  Mouth/Throat: No oropharyngeal exudate or posterior oropharyngeal edema.  Eyes: Pupils are equal, round, and reactive to light. Conjunctivae, EOM and lids are normal.  Neck: No JVD present. Carotid bruit is not present. No edema present. No thyroid mass and no thyromegaly present.  Cardiovascular: S1 normal and S2 normal. Exam reveals no gallop.  No murmur heard. Pulses:      Dorsalis pedis pulses are 2+ on the right side and 2+ on the left side.  Respiratory: No respiratory distress. He has no wheezes. He has no rhonchi. He has no rales.  GI: Soft. Bowel sounds are normal. There is no abdominal tenderness.  Musculoskeletal:     Right ankle: He exhibits no swelling.     Left ankle: He exhibits no swelling.  Lymphadenopathy:    He has no cervical adenopathy.  Neurological: He is alert and  oriented to person, place, and time. No cranial nerve deficit.  Skin: Skin is warm. Nails show no clubbing.  Chronic lower extremity skin discoloration.  Looks like a large mole on the left arm.  Fingertips some blackish areas.  Patient states that this happens in the winter and gets better in the summer.  Psychiatric: He has a normal mood and affect.      Data Reviewed: Basic Metabolic Panel: Recent Labs  Lab 01/06/19 2053 01/07/19 0443 01/07/19 0615  NA 131* 134*  --   K 3.9 4.0  --   CL 103 106  --   CO2 21* 21*  --   GLUCOSE 100* 86  --   BUN 19 17  --   CREATININE 0.94 0.90  --   CALCIUM 7.7* 7.7*  --   MG  --   --  2.1  PHOS  --   --  4.1   Liver Function Tests: Recent Labs  Lab 01/06/19 2053 01/07/19 0443  AST 153* 140*  ALT 79* 70*  ALKPHOS 181* 179*  BILITOT 0.4 0.6  PROT 8.3* 7.7  ALBUMIN 2.5* 2.2*    Recent Labs  Lab 01/06/19 2053  AMMONIA 26   CBC: Recent Labs  Lab 01/06/19 2053 01/07/19 0443 01/08/19 0407  WBC 2.7* 3.0* 3.3*  NEUTROABS  --   --  2.4  HGB 9.0* 8.2* 7.2*  HCT 29.5* 27.3* 24.2*  MCV 81.5 82.5 84.0  PLT 106* 100* 92*     Recent Results (from  the past 240 hour(s))  Urine culture     Status: None   Collection Time: 01/06/19  8:53 PM  Result Value Ref Range Status   Specimen Description   Final    URINE, CLEAN CATCH Performed at [facility], [address], [place], [place] [address]    Special Requests   Final    NONE Performed at [facility], [address], [place], [place] [address]    Culture   Final    NO GROWTH Performed at [facility], [address], [place], [place] [address]    Report Status 01/08/2019 FINAL  Final  SARS Coronavirus 2 (CEPHEID - Performed in [facility] hospital lab), Hosp Order     Status: None   Collection Time: 01/07/19  2:17 AM  Result Value Ref Range Status   SARS Coronavirus 2 NEGATIVE NEGATIVE Final    Comment: (NOTE) If result is NEGATIVE SARS-CoV-2  target nucleic acids are NOT DETECTED. The SARS-CoV-2 RNA is generally detectable in upper and lower  respiratory specimens during the acute phase of infection. The lowest  concentration of SARS-CoV-2 viral copies this assay can detect is 250  copies / mL. A negative result does not preclude SARS-CoV-2 infection  and should not be used as the sole basis for treatment or other  patient management decisions.  A negative result may occur with  improper specimen collection / handling, submission of specimen other  than nasopharyngeal swab, presence of viral mutation(s) within the  areas targeted by this assay, and inadequate number of viral copies  (<250 copies / mL). A negative result must be combined with clinical  observations, patient history, and epidemiological information. If result is POSITIVE SARS-CoV-2 target nucleic acids are DETECTED. The SARS-CoV-2 RNA is generally detectable in upper and lower  respiratory specimens dur ing the acute phase of infection.  Positive  results are indicative of active infection with SARS-CoV-2.  Clinical  correlation with patient history and other diagnostic information is  necessary to determine patient infection status.  Positive results do  not rule out bacterial infection or co-infection with other viruses. If result is PRESUMPTIVE POSTIVE SARS-CoV-2 nucleic acids MAY BE PRESENT.   A presumptive positive result was obtained on the submitted specimen  and confirmed on repeat testing.  While 2019 novel coronavirus  (SARS-CoV-2) nucleic acids may be present in the submitted sample  additional confirmatory testing may be necessary for epidemiological  and / or clinical management purposes  to differentiate between  SARS-CoV-2 and other Sarbecovirus currently known to infect humans.  If clinically indicated additional testing with an alternate test  methodology [phone]) is advised. The SARS-CoV-2 RNA is generally  detectable in upper and lower  respiratory sp ecimens during the acute  phase of infection. The expected result is Negative. Fact Sheet for Patients:  BoilerBrush.com.cy Fact Sheet for Healthcare Providers: https://pope.com/ This test is not yet approved or cleared by the [place] FDA and has been authorized for detection and/or diagnosis of SARS-CoV-2 by FDA under an Emergency Use Authorization (EUA).  This EUA will remain in effect (meaning this test can be used) for the duration of the COVID-19 declaration under Section 564(b)(1) of the Act, 21 U.S.C. section 360bbb-3(b)(1), unless the authorization is terminated or revoked sooner. Performed at [facility], [address], [place], [place] [address]      Studies: Dg Chest 1 View  Result Date: 01/07/2019 CLINICAL DATA:  Lateral view.  Altered mental status.  Cough. EXAM: CHEST  1 VIEW COMPARISON:  None. FINDINGS: Examination is limited by single lateral view. There is no acute cardiopulmonary process. No displaced fracture. The heart size is unchanged from prior study. IMPRESSION: No active disease. Electronically Signed   By: Katherine Mantle M.D.   On: 01/07/2019 01:07   Ct Head Wo Contrast  Result Date: 01/06/2019 CLINICAL DATA:  Encephalopathy EXAM: CT HEAD WITHOUT CONTRAST TECHNIQUE: Contiguous axial images were obtained from the base of the skull through the vertex without intravenous contrast. COMPARISON:  None. FINDINGS: Brain: No evidence of acute infarction, hemorrhage, hydrocephalus, extra-axial collection or mass lesion/mass effect. There is age related volume loss and microvascular ischemic changes. Vascular: No hyperdense vessel or unexpected calcification. Skull: Normal. Negative for fracture or focal lesion. Sinuses/Orbits: No acute finding. Other: None. IMPRESSION: No acute intracranial abnormality detected. Electronically Signed   By: Katherine Mantle M.D.   On: 01/06/2019 23:34   Ct  Abdomen Pelvis W Contrast  Result Date: 01/07/2019 CLINICAL DATA:  Altered mental status. EXAM: CT ABDOMEN AND PELVIS WITH CONTRAST TECHNIQUE: Multidetector CT imaging of the abdomen and pelvis was performed using the standard protocol following bolus administration of intravenous contrast. CONTRAST:  76mL OMNIPAQUE IOHEXOL 300 MG/ML  SOLN COMPARISON:  Ultrasound 01/06/2019 FINDINGS: Lower chest: Lung bases are clear. Hepatobiliary: No focal hepatic lesion. No biliary duct dilatation. Gallbladder is normal. Common bile duct is normal. Pancreas: Pancreas is normal. No ductal dilatation. No pancreatic inflammation. Spleen: Normal spleen Adrenals/urinary tract: Adrenal glands and kidneys are normal. The ureters and bladder normal. Stomach/Bowel: Stomach duodenum normal. Small bowel normal. Appendix normal. Moderate volume stool in the ascending, transverse and descending colon moderate volume stool in the sigmoid colon rectum. No obstructing lesion identified. Vascular/Lymphatic: Abdominal aorta is normal caliber with atherosclerotic calcification. There is no retroperitoneal or periportal lymphadenopathy. No pelvic lymphadenopathy. Reproductive: Prostate normal Other: No free fluid or abscess in the abdomen pelvis. Musculoskeletal: Bilateral pars defects at L5 with grade 1 anterolisthesis of L5 on S1. IMPRESSION: 1. Moderate volume stool throughout the colon suggests constipation. 2. No evidence of abdominal infection or inflammation. 3. 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   On: 01/07/2019 16:39   Dg Chest Portable 1 View  Result Date: 01/06/2019 CLINICAL DATA:  Altered mental status EXAM: PORTABLE CHEST 1 VIEW COMPARISON:  None. FINDINGS: Cardiac shadows within normal limits. The lungs are well aerated bilaterally. No focal infiltrate or sizable effusion is seen. No acute bony abnormality is noted. IMPRESSION: No active disease.  Electronically Signed   By: Alcide Clever M.D.   On: 01/06/2019 21:20   US Abdomen Limited Ruq  Result Date: 01/07/2019 CLINICAL DATA:  Elevated LFTs EXAM: ULTRASOUND ABDOMEN LIMITED RIGHT UPPER QUADRANT COMPARISON:  None. FINDINGS: Gallbladder: No gallstones or wall thickening visualized. No sonographic Murphy sign noted by sonographer. 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 Common bile duct: Diameter: 0.2 cm Liver: No focal lesion identified. Within normal limits in parenchymal echogenicity. Portal vein is patent on color Doppler imaging with normal direction of blood flow towards the liver. IMPRESSION: No acute sonographic abnormality detected. No evidence for cholelithiasis. Electronically Signed   By: Katherine Mantle M.D.   On: 01/07/2019 00:36    Scheduled Meds: . cyanocobalamin  1,000 mcg Intramuscular Daily  . feeding supplement (ENSURE ENLIVE)  1 Bottle Oral TID BM  . folic acid  1 mg Oral Daily  . multivitamin with minerals  1 tablet  Oral Daily  . pantoprazole  40 mg Oral Daily  . sodium chloride flush  3 mL Intravenous Q12H  . thiamine  100 mg Oral Daily   Continuous Infusions:   Assessment/Plan:  1. Pancytopenia.  Guaiac stools sent this morning.  If hemoglobin drops down further may end up needing a blood transfusion.  Sent off a type and cross.  Consulted hematology.  Consulted gastroenterology to see if they wanted to do endoscopy and colonoscopy. 2. B12 deficiency.  Will give IM B12 shots while here in the hospital and oral B12 upon getting out of the hospital.  Will need probably B12 injections on a monthly basis. 3. Elevated liver function test.  Abdominal ultrasound negative.  CT scan abdomen negative.  Hepatitis profiles negative. 4. Protein calorie malnutrition. 5. Orthostatic hypotension.  Hold atenolol.  TED hose.  Stop IV fluids today.  Code Status:     Code Status Orders  (From admission, onward)         Start     Ordered   01/07/19 0318  Full code   Continuous     01/07/19 0317        Code Status History    This patient has a current code status but no historical code status.     Family Communication: Spoke with brother on the phone yesterday.  Will call in the afternoon today after his specialist see. Disposition Plan: To be determined based on clinical course  Time spent: 27 minutes.  Case discussed with gastroenterology and I also messaged oncology.  Translator present with history and physical.  Alford Highlandichard Aiysha Jillson  [employer]

## 2019-01-08 NOTE — Consult Note (Signed)
Wyline MoodKiran Jaelene Garciagarcia , MD [address], [address], [place], [place], [id number] [address], [address], [place], [place], [address] Phone: [phone]  Fax: [phone]  Consultation  Referring Provider: Dr Renae GlossWieting Primary Care Physician:  Patient, No Pcp Per Primary Gastroenterologist:  None         Reason for Consultation:     Anemia   Date of Admission:  01/06/2019 Date of Consultation:  01/08/2019         HPI:   Ryan Matthews is a 68 y.o. male was admitted on 01/07/2019 with altered mental status.  Apparently the confusion began 2 days prior.  In the emergency room he had a CT scan of the head which showed no acute abnormalities.  LFTs were elevated and found to be anemic with a hemoglobin of 9 g and MCV of 81.5.  His other cell counts were also low with a platelet count of 106 white cell count of 2.7.  No prior labs available to compare with.  Albumin low at 2.5.  Urine shows moderate blood in the urine.  TSH normal.  Folate low at 143.  COVID negative HIV negative.  Acute hepatitis panel negative.  Ferritin normal at 124.,  Folate normal at 20.  Iron studies showed normal TIBC.  Magnesium 2.1.  Right upper quadrant ultrasound shows no abnormalities.  CT scan of the abdomen shows moderate volume of stool throughout the colon.  The patient denies any rectal bleeding/hematemesis/weight loss. Says appetite is good . No other issues.     Past Medical History:  Diagnosis Date   Dementia (HCC)    Hypertension       Prior to Admission medications   Not on File    No family history on file.   Social History   Tobacco Use   Smoking status: Not on file  Substance Use Topics   Alcohol use: Not on file   Drug use: Not on file    Allergies as of 01/06/2019   (No Known Allergies)    Review of Systems:    All systems reviewed and negative except where noted in HPI.   Physical Exam:  Vital signs in last 24 hours: Temp:  [97.6 F (36.4 C)-98.6 F (37 C)] 97.6 F (36.4  C) (05/27 1000) Pulse Rate:  [72-112] 83 (05/27 1000) Resp:  [16-20] 18 (05/27 1000) BP: (86-121)/(68-93) 116/77 (05/27 1000) SpO2:  [97 %-100 %] 100 % (05/27 1000) Last BM Date: 01/07/19 General:   Pleasant, cooperative in NAD, very thin  Head:  Normocephalic and atraumatic. Eyes:   No icterus.   Conjunctiva pink. PERRLA. Ears:  Normal auditory acuity. Neck:  Supple; no masses or thyroidomegaly Lungs: Respirations even and unlabored. Lungs clear to auscultation bilaterally.   No wheezes, crackles, or rhonchi.  Heart:  Regular rate and rhythm;  Without murmur, clicks, rubs or gallops Abdomen:  Soft, nondistended, nontender. Normal bowel sounds. No appreciable masses or hepatomegaly.  No rebound or guarding.  Neurologic:  Alert and oriented x3;  grossly normal neurologically. Skin:  Intact without significant lesions or rashes. Cervical Nodes:  No significant cervical adenopathy. Psych:  Alert and cooperative. Normal affect.  LAB RESULTS: Recent Labs    01/06/19 2053 01/07/19 0443 01/08/19 0407  WBC 2.7* 3.0* 3.3*  HGB 9.0* 8.2* 7.2*  HCT 29.5* 27.3* 24.2*  PLT 106* 100* 92*   BMET Recent Labs    01/06/19 2053 01/07/19 0443  NA 131* 134*  K 3.9 4.0  CL 103 106  CO2 21* 21*  GLUCOSE 100* 86  BUN 19 17  CREATININE 0.94 0.90  CALCIUM 7.7* 7.7*   LFT Recent Labs    01/07/19 0443  PROT 7.7  ALBUMIN 2.2*  AST 140*  ALT 70*  ALKPHOS 179*  BILITOT 0.6  BILIDIR 0.2  IBILI 0.4   PT/INR No results for input(s): LABPROT, INR in the last 72 hours.  STUDIES: Dg Chest 1 View  Result Date: 01/07/2019 CLINICAL DATA:  Lateral view.  Altered mental status.  Cough. EXAM: CHEST  1 VIEW COMPARISON:  None. FINDINGS: Examination is limited by single lateral view. There is no acute cardiopulmonary process. No displaced fracture. The heart size is unchanged from prior study. IMPRESSION: No active disease. Electronically Signed   By: Katherine Mantle M.D.   On: 01/07/2019 01:07     Ct Head Wo Contrast  Result Date: 01/06/2019 CLINICAL DATA:  Encephalopathy EXAM: CT HEAD WITHOUT CONTRAST TECHNIQUE: Contiguous axial images were obtained from the base of the skull through the vertex without intravenous contrast. COMPARISON:  None. FINDINGS: Brain: No evidence of acute infarction, hemorrhage, hydrocephalus, extra-axial collection or mass lesion/mass effect. There is age related volume loss and microvascular ischemic changes. Vascular: No hyperdense vessel or unexpected calcification. Skull: Normal. Negative for fracture or focal lesion. Sinuses/Orbits: No acute finding. Other: None. IMPRESSION: No acute intracranial abnormality detected. Electronically Signed   By: Katherine Mantle M.D.   On: 01/06/2019 23:34   Ct Abdomen Pelvis W Contrast  Result Date: 01/07/2019 CLINICAL DATA:  Altered mental status. EXAM: CT ABDOMEN AND PELVIS WITH CONTRAST TECHNIQUE: Multidetector CT imaging of the abdomen and pelvis was performed using the standard protocol following bolus administration of intravenous contrast. CONTRAST:  32mL OMNIPAQUE IOHEXOL 300 MG/ML  SOLN COMPARISON:  Ultrasound 01/06/2019 FINDINGS: Lower chest: Lung bases are clear. Hepatobiliary: No focal hepatic lesion. No biliary duct dilatation. Gallbladder is normal. Common bile duct is normal. Pancreas: Pancreas is normal. No ductal dilatation. No pancreatic inflammation. Spleen: Normal spleen Adrenals/urinary tract: Adrenal glands and kidneys are normal. The ureters and bladder normal. Stomach/Bowel: Stomach duodenum normal. Small bowel normal. Appendix normal. Moderate volume stool in the ascending, transverse and descending colon moderate volume stool in the sigmoid colon rectum. No obstructing lesion identified. Vascular/Lymphatic: Abdominal aorta is normal caliber with atherosclerotic calcification. There is no retroperitoneal or periportal lymphadenopathy. No pelvic lymphadenopathy. Reproductive: Prostate normal Other: No free  fluid or abscess in the abdomen pelvis. Musculoskeletal: Bilateral pars defects at L5 with grade 1 anterolisthesis of L5 on S1. IMPRESSION: 1. Moderate volume stool throughout the colon suggests constipation. 2. No evidence of abdominal infection or inflammation. 3. 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   On: 01/07/2019 16:39   Dg Chest Portable 1 View  Result Date: 01/06/2019 CLINICAL DATA:  Altered mental status EXAM: PORTABLE CHEST 1 VIEW COMPARISON:  None. FINDINGS: Cardiac shadows within normal limits. The lungs are well aerated bilaterally. No focal infiltrate or sizable effusion is seen. No acute bony abnormality is noted. IMPRESSION: No active disease. Electronically Signed   By: Alcide Clever M.D.   On: 01/06/2019 21:20   US Abdomen Limited Ruq  Result Date: 01/07/2019 CLINICAL DATA:  Elevated LFTs EXAM: ULTRASOUND ABDOMEN LIMITED RIGHT UPPER QUADRANT COMPARISON:  None. FINDINGS: Gallbladder: No gallstones or wall thickening visualized. No sonographic Murphy sign noted by sonographer. 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 Common bile duct: Diameter: 0.2 cm Liver: No focal lesion identified.  Within normal limits in parenchymal echogenicity. Portal vein is patent on color Doppler imaging with normal direction of blood flow towards the liver. IMPRESSION: No acute sonographic abnormality detected. No evidence for cholelithiasis. Electronically Signed   By: Katherine Mantle M.D.   On: 01/07/2019 00:36      Impression / Plan:   Ryan Matthews is a 68 y.o. y/o male presented to the hospital with altered mental status.  I have been consulted for a normocytic anemia with normal iron studies and very low B12 levels.  CBC demonstrates a pancytopenia.  Liver function tests are abnormal particularly transaminases and alkaline phosphatase.  No biliary obstruction seen on ultrasound of the liver.  Albumin is low at very low at 2.5.   No PT/INR available.  My main concerns with this patient are 1.  Because of abnormal liver function tests.  Obtain CK, autoimmune liver work-up.  Check PT/INR. 2.  Check for vitamin C deficiency, copper levels.  [Microcytic anemia with normal iron studies can be seen in copper deficiency] if negative can check for hemoglobinopathies. 3.  Supplement thiamine as confusion may also be related to nutritional deficiencies.  Replace B12. 4.  I would also suggest to obtain a hematology consult to determine if any further work-up needs to be performed to determine the cause of his anemia or if this is all related to the B12 deficiency. 5.  Check intrinsic factor and antiparietal cell antibody. 6.  Blood in urine seen would need further evaluation  7. Ensure TID.  8. I discussed with Dr Cathie Hoops since no iron deficiency less likely cause of anemia is blood loss. She feels he may have MDS and will inform me if there is a need for endoscopy in the future.   Thank you for involving me in the care of this patient.      LOS: 1 day   Wyline Mood, MD  01/08/2019, 10:33 AM

## 2019-01-08 NOTE — Consult Note (Signed)
Hematology/Oncology Consult note [facility] [phone] [phone]  Patient Care Team: Patient, No Pcp Per as PCP - General (General Practice)   Name of the patient: Ryan Matthews  [id number]  [birth year]   Date of visit: 01/08/19 REASON FOR COSULTATION:  Pancytopenia History of presenting illness-  68 y.o. male with PMH listed at below including mild dementia, hypertension who was sent to emergency room via EMS service from home for evaluation of confusion and weakness. Per note, patient's brother reports that patient has been confused over the past 2 days with decreased appetite and decreased p.o. intake.  Work-up in ER includes CT brain which shows no acute intracranial abnormality.  CT chest shows no acute pulmonary disease.  Right upper quadrant ultrasound demonstrated no acute abnormality. Lab work-up shows elevated LFT with AST 153, ALT 79, alkaline phosphatase 181, pancytopenic with hemoglobin 9, WBC 2.7, platelet counts 106,000.  No prior baseline to compare with.  Vitamin B12 143 Stool occult blood was positive. CT abdomen pelvis with contrast showed moderate volume stool throughout the colon suggesting constipation.  No evidence of abdominal infection or inflammation.  Bilateral pars defect at L5 with grade 1 spondylolisthesis Heme-onc was consulted for evaluation of pancytopenia.  He is Spanish-speaking.  Spanish interpreter was present for translation for the entire encounter. Patient reports that he has been working at a [employer] site and to COVID-19 pandemic.  Review of Systems  Unable to perform ROS: Dementia  Constitutional: Positive for appetite change and fatigue.  Respiratory: Negative for shortness of breath.   Gastrointestinal: Positive for constipation. Negative for abdominal pain.  Hematological: Negative for adenopathy.  Psychiatric/Behavioral: Negative for depression.    No Known Allergies  Patient Active  Problem List   Diagnosis Date Noted   Altered mental status 01/07/2019     Past Medical History:  Diagnosis Date   Dementia ([place])    Hypertension    Surgical history, social history, family history, smoking history were not obtained due to dementia.    Social History   Socioeconomic History   Marital status: Single    Spouse name: Not on file   Number of children: Not on file   Years of education: Not on file   Highest education level: Not on file  Occupational History   Not on file  Social Needs   Financial resource strain: Not on file   Food insecurity:    Worry: Not on file    Inability: Not on file   Transportation needs:    Medical: Not on file    Non-medical: Not on file  Tobacco Use   Smoking status: Not on file  Substance and Sexual Activity   Alcohol use: Not on file   Drug use: Not on file   Sexual activity: Not on file  Lifestyle   Physical activity:    Days per week: Not on file    Minutes per session: Not on file   Stress: Not on file  Relationships   Social connections:    Talks on phone: Not on file    Gets together: Not on file    Attends religious service: Not on file    Active member of club or organization: Not on file    Attends meetings of clubs or organizations: Not on file    Relationship status: Not on file   Intimate partner violence:    Fear of current or ex partner: Not on file    Emotionally abused: Not on  file    Physically abused: Not on file    Forced sexual activity: Not on file  Other Topics Concern   Not on file  Social History Narrative   Not on file     No family history on file.  No able to provide family history due to dementia   Current Facility-Administered Medications:    cyanocobalamin ((VITAMIN B-12)) injection 1,000 mcg, 1,000 mcg, Intramuscular, Daily, Leslye Peer, Richard, MD, 1,000 mcg at 01/08/19 6767   feeding supplement (ENSURE ENLIVE) (ENSURE ENLIVE) liquid 237 mL, 1 Bottle, Oral,  TID BM, Mayo, Pete Pelt, MD, 237 mL at 20/94/70 9628   folic acid (FOLVITE) tablet 1 mg, 1 mg, Oral, Daily, Seals, Angela H, NP, 1 mg at 01/08/19 3662   multivitamin with minerals tablet 1 tablet, 1 tablet, Oral, Daily, Seals, Theo Dills, NP, 1 tablet at 01/08/19 0918   ondansetron (ZOFRAN) tablet 4 mg, 4 mg, Oral, Q6H PRN **OR** ondansetron (ZOFRAN) injection 4 mg, 4 mg, Intravenous, Q6H PRN, Seals, Angela H, NP   pantoprazole (PROTONIX) EC tablet 40 mg, 40 mg, Oral, Daily, Seals, Angela H, NP, 40 mg at 01/08/19 0918   polyethylene glycol (MIRALAX / GLYCOLAX) packet 17 g, 17 g, Oral, Daily PRN, Seals, Angela H, NP, 17 g at 01/07/19 1014   sodium chloride flush (NS) 0.9 % injection 3 mL, 3 mL, Intravenous, Q12H, Seals, Angela H, NP, 3 mL at 01/08/19 9476   thiamine (VITAMIN B-1) tablet 100 mg, 100 mg, Oral, Daily, Seals, Angela H, NP, 100 mg at 01/08/19 0918   Physical exam:  Vitals:   01/08/19 0552 01/08/19 0559 01/08/19 1000 01/08/19 1602  BP: 113/89  116/77 112/84  Pulse: (!) 112 72 83 84  Resp: '16  18 16  ' Temp: 98.6 F (37 C)  97.6 F (36.4 C) 98.3 F (36.8 C)  TempSrc: Oral  Oral Oral  SpO2: 100%  100% 98%  Weight:      Height:       Physical Exam  Constitutional: No distress.  Cachectic, lying in bed.  Ill-appearing  HENT:  Head: Normocephalic and atraumatic.  Eyes: Pupils are equal, round, and reactive to light. EOM are normal.  Neck: Normal range of motion. Neck supple.  Cardiovascular: Normal rate and regular rhythm.  Pulmonary/Chest: Effort normal. No respiratory distress.  Abdominal: Soft. Bowel sounds are normal. He exhibits no distension.  Musculoskeletal: Normal range of motion.        General: No edema.  Neurological: He is alert.  Orientated to name, place.  Able to tell the year but not the month  Skin: Skin is warm and dry. He is not diaphoretic. No erythema.  Psychiatric: Affect normal.        CMP Latest Ref Rng & Units 01/07/2019  Glucose 70 -  99 mg/dL 86  BUN 8 - 23 mg/dL 17  Creatinine 0.61 - 1.24 mg/dL 0.90  Sodium 135 - 145 mmol/L 134(L)  Potassium 3.5 - 5.1 mmol/L 4.0  Chloride 98 - 111 mmol/L 106  CO2 22 - 32 mmol/L 21(L)  Calcium 8.9 - 10.3 mg/dL 7.7(L)  Total Protein 6.5 - 8.1 g/dL 7.7  Total Bilirubin 0.3 - 1.2 mg/dL 0.6  Alkaline Phos 38 - 126 U/L 179(H)  AST 15 - 41 U/L 140(H)  ALT 0 - 44 U/L 70(H)   CBC Latest Ref Rng & Units 01/08/2019  WBC 4.0 - 10.5 K/uL 3.3(L)  Hemoglobin 13.0 - 17.0 g/dL 7.2(L)  Hematocrit 39.0 - 52.0 % 24.2(L)  Platelets 150 - 400 K/uL 92(L)   RADIOGRAPHIC STUDIES: I have personally reviewed the radiological images as listed and agreed with the findings in the report.  Dg Chest 1 View  Result Date: 01/07/2019 CLINICAL DATA:  Lateral view.  Altered mental status.  Cough. EXAM: CHEST  1 VIEW COMPARISON:  None. FINDINGS: Examination is limited by single lateral view. There is no acute cardiopulmonary process. No displaced fracture. The heart size is unchanged from prior study. IMPRESSION: No active disease. Electronically Signed   By: Constance Holster M.D.   On: 01/07/2019 01:07   Ct Head Wo Contrast  Result Date: 01/06/2019 CLINICAL DATA:  Encephalopathy EXAM: CT HEAD WITHOUT CONTRAST TECHNIQUE: Contiguous axial images were obtained from the base of the skull through the vertex without intravenous contrast. COMPARISON:  None. FINDINGS: Brain: No evidence of acute infarction, hemorrhage, hydrocephalus, extra-axial collection or mass lesion/mass effect. There is age related volume loss and microvascular ischemic changes. Vascular: No hyperdense vessel or unexpected calcification. Skull: Normal. Negative for fracture or focal lesion. Sinuses/Orbits: No acute finding. Other: None. IMPRESSION: No acute intracranial abnormality detected. Electronically Signed   By: Constance Holster M.D.   On: 01/06/2019 23:34   Ct Abdomen Pelvis W Contrast  Result Date: 01/07/2019 CLINICAL DATA:  Altered  mental status. EXAM: CT ABDOMEN AND PELVIS WITH CONTRAST TECHNIQUE: Multidetector CT imaging of the abdomen and pelvis was performed using the standard protocol following bolus administration of intravenous contrast. CONTRAST:  61m OMNIPAQUE IOHEXOL 300 MG/ML  SOLN COMPARISON:  Ultrasound 01/06/2019 FINDINGS: Lower chest: Lung bases are clear. Hepatobiliary: No focal hepatic lesion. No biliary duct dilatation. Gallbladder is normal. Common bile duct is normal. Pancreas: Pancreas is normal. No ductal dilatation. No pancreatic inflammation. Spleen: Normal spleen Adrenals/urinary tract: Adrenal glands and kidneys are normal. The ureters and bladder normal. Stomach/Bowel: Stomach duodenum normal. Small bowel normal. Appendix normal. Moderate volume stool in the ascending, transverse and descending colon moderate volume stool in the sigmoid colon rectum. No obstructing lesion identified. Vascular/Lymphatic: Abdominal aorta is normal caliber with atherosclerotic calcification. There is no retroperitoneal or periportal lymphadenopathy. No pelvic lymphadenopathy. Reproductive: Prostate normal Other: No free fluid or abscess in the abdomen pelvis. Musculoskeletal: Bilateral pars defects at L5 with grade 1 anterolisthesis of L5 on S1. IMPRESSION: 1. Moderate volume stool throughout the colon suggests constipation. 2. No evidence of abdominal infection or inflammation. 3. 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   On: 01/07/2019 16:39   Dg Chest Portable 1 View  Result Date: 01/06/2019 CLINICAL DATA:  Altered mental status EXAM: PORTABLE CHEST 1 VIEW COMPARISON:  None. FINDINGS: Cardiac shadows within normal limits. The lungs are well aerated bilaterally. No focal infiltrate or sizable effusion is seen. No acute bony abnormality is noted. IMPRESSION: No active disease. Electronically Signed   By: MInez CatalinaM.D.   On: 01/06/2019 21:20   [place]Abdomen  Limited Ruq  Result Date: 01/07/2019 CLINICAL DATA:  Elevated LFTs EXAM: ULTRASOUND ABDOMEN LIMITED RIGHT UPPER QUADRANT COMPARISON:  None. FINDINGS: Gallbladder: No gallstones or wall thickening visualized. No sonographic Murphy sign noted by sonographer. 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 Common bile duct: Diameter: 0.2 cm Liver: No focal lesion identified. Within normal limits in parenchymal echogenicity. Portal vein is patent on color Doppler imaging with normal direction of blood flow towards the liver. IMPRESSION: No acute sonographic abnormality detected. No evidence for cholelithiasis. Electronically Signed   By: CJamie KatoD.  On: 01/07/2019 00:36    Assessment and plan- Patient is a 68 y.o. male with history of dementia and hypertension presents for evaluation of altered mental status, weakness, decreased p.o. intake.  Lab work reviews transaminitis, positive stool occult, pancytopenia.  #Pancytopenia No previous baseline to compare. Discussed with pathology Dr. Ronnald Ramp who reviewed patient's blood smear today.  Patient smear shows target cells and teardrop cells, overall picture is concerning for underlying bone marrow process.  Patient will need a bone marrow biopsy done for further evaluation. Transfusion if hemoglobin 7.   check LDH, flowcytometry. Hemoglobinopathy, multiple myeloma panel.   #Vitamin B12 deficiency, recommend IM vitamin B12 injection daily x5 followed by weekly B12 injection outpatient. Vitamin deficiency potentially can cause pancytopenia.  However patient smear did not show characteristic features for vitamin B12 deficiency, such as macrocytosis or hypersegmentation of neutrophils.  Per my discussion with pathology, his neutrophils actually shows hyper segmental neutrophils, and overall macrocytic. Given that B12 potentially can interfere with morphology interpretation of bone marrow evaluation, I recommend continue supportive care, B12 repletion, and  obtain outpatient bone marrow evaluation after B12 stores replete. Check intrinsic factor and antiparietal antibody.  #Cachexia/protein calorie malnutrition: Dietitian consultation. Physical therapy  #Positive stool occult, abnormal liver functions pending GI work-up. Reviewed patient's iron panel, ferritin 124, saturation 16.  TIBC 299, not consistent with iron deficiency. Agree with checking vitamin C level, copper level. Recommend outpatient GI follow up for endoscopy.  Discussed with GI Dr.Anna.    Plan discussed with Dr.Wieting via secure chat.  Thank you for allowing me to participate in the care of this patient.  Total face to face encounter time for this patient visit was 70 min. >50% of the time was  spent in counseling and coordination of care.    Earlie Server, MD, PhD Hematology Oncology [facility] Pager- [phone] 01/08/2019

## 2019-01-09 ENCOUNTER — Encounter: Payer: Self-pay | Admitting: Oncology

## 2019-01-09 ENCOUNTER — Other Ambulatory Visit: Payer: Self-pay | Admitting: Oncology

## 2019-01-09 DIAGNOSIS — D61818 Other pancytopenia: Secondary | ICD-10-CM

## 2019-01-09 DIAGNOSIS — E538 Deficiency of other specified B group vitamins: Secondary | ICD-10-CM

## 2019-01-09 DIAGNOSIS — R945 Abnormal results of liver function studies: Secondary | ICD-10-CM

## 2019-01-09 HISTORY — DX: Other pancytopenia: D61.818

## 2019-01-09 LAB — CBC
HCT: 23.9 % — ABNORMAL LOW (ref 39.0–52.0)
Hemoglobin: 7.2 g/dL — ABNORMAL LOW (ref 13.0–17.0)
MCH: 25.4 pg — ABNORMAL LOW (ref 26.0–34.0)
MCHC: 30.1 g/dL (ref 30.0–36.0)
MCV: 84.2 fL (ref 80.0–100.0)
Platelets: 89 10*3/uL — ABNORMAL LOW (ref 150–400)
RBC: 2.84 MIL/uL — ABNORMAL LOW (ref 4.22–5.81)
RDW: 24.8 % — ABNORMAL HIGH (ref 11.5–15.5)
WBC: 2.6 10*3/uL — ABNORMAL LOW (ref 4.0–10.5)
nRBC: 0 % (ref 0.0–0.2)

## 2019-01-09 LAB — ENA+DNA/DS+ANTICH+CENTRO+JO...
Anti JO-1: <0.2 AI (ref 0.0–0.9)
Centromere Ab Screen: <0.2 AI (ref 0.0–0.9)
Chromatin Ab SerPl-aCnc: >8 AI — ABNORMAL HIGH (ref 0.0–0.9)
ENA SM Ab Ser-aCnc: <0.2 AI (ref 0.0–0.9)
Ribonucleic Protein: 0.4 AI (ref 0.0–0.9)
SSA (Ro) (ENA) Antibody, IgG: <0.2 AI (ref 0.0–0.9)
SSB (La) (ENA) Antibody, IgG: <0.2 AI (ref 0.0–0.9)
Scleroderma (Scl-70) (ENA) Antibody, IgG: 0.3 AI (ref 0.0–0.9)
ds DNA Ab: 56 IU/mL — ABNORMAL HIGH (ref 0–9)

## 2019-01-09 LAB — COMPREHENSIVE METABOLIC PANEL
ALT: 56 U/L — ABNORMAL HIGH (ref 0–44)
AST: 117 U/L — ABNORMAL HIGH (ref 15–41)
Albumin: 1.9 g/dL — ABNORMAL LOW (ref 3.5–5.0)
Alkaline Phosphatase: 151 U/L — ABNORMAL HIGH (ref 38–126)
Anion gap: 4 — ABNORMAL LOW (ref 5–15)
BUN: 15 mg/dL (ref 8–23)
CO2: 20 mmol/L — ABNORMAL LOW (ref 22–32)
Calcium: 7.1 mg/dL — ABNORMAL LOW (ref 8.9–10.3)
Chloride: 112 mmol/L — ABNORMAL HIGH (ref 98–111)
Creatinine, Ser: 0.84 mg/dL (ref 0.61–1.24)
GFR calc Af Amer: >60 mL/min (ref >60)
GFR calc non Af Amer: >60 mL/min (ref >60)
Glucose, Bld: 88 mg/dL (ref 70–99)
Potassium: 3.6 mmol/L (ref 3.5–5.1)
Sodium: 136 mmol/L (ref 135–145)
Total Bilirubin: 0.2 mg/dL — ABNORMAL LOW (ref 0.3–1.2)
Total Protein: 6.5 g/dL (ref 6.5–8.1)

## 2019-01-09 LAB — ANA W/REFLEX IF POSITIVE: Anti Nuclear Antibody (ANA): POSITIVE — AB

## 2019-01-09 LAB — URIC ACID: Uric Acid, Serum: 5.1 mg/dL (ref 3.7–8.6)

## 2019-01-09 LAB — ABO/RH: ABO/RH(D): O POS

## 2019-01-09 LAB — HEMOGLOBINOPATHY EVALUATION
Hgb A2 Quant: 2.6 % (ref 1.8–3.2)
Hgb A: 97.4 % (ref 96.4–98.8)
Hgb C: 0 %
Hgb F Quant: 0 % (ref 0.0–2.0)
Hgb S Quant: 0 %
Hgb Variant: 0 %

## 2019-01-09 LAB — CERULOPLASMIN: Ceruloplasmin: 16.4 mg/dL (ref 16.0–31.0)

## 2019-01-09 LAB — PTH, INTACT AND CALCIUM
Calcium, Total (PTH): 7.3 mg/dL — ABNORMAL LOW (ref 8.6–10.2)
PTH: 16 pg/mL (ref 15–65)

## 2019-01-09 LAB — LACTATE DEHYDROGENASE: LDH: 223 U/L — ABNORMAL HIGH (ref 98–192)

## 2019-01-09 LAB — RPR: RPR Ser Ql: NONREACTIVE

## 2019-01-09 LAB — ANTI-SMOOTH MUSCLE ANTIBODY, IGG: F-Actin IgG: 23 Units — ABNORMAL HIGH (ref 0–19)

## 2019-01-09 LAB — VITAMIN D 25 HYDROXY (VIT D DEFICIENCY, FRACTURES): Vit D, 25-Hydroxy: 12.7 ng/mL — ABNORMAL LOW (ref 30.0–100.0)

## 2019-01-09 LAB — PREPARE RBC (CROSSMATCH)

## 2019-01-09 MED ORDER — FUROSEMIDE 10 MG/ML IJ SOLN
20.0000 mg | Freq: Once | INTRAMUSCULAR | Status: AC
  Administered 2019-01-09: 11:00:00 20 mg via INTRAVENOUS
  Filled 2019-01-09: qty 2

## 2019-01-09 MED ORDER — ENSURE ENLIVE PO LIQD: 1.0000 | Freq: Three times a day (TID) | ORAL | 0 refills | Status: AC

## 2019-01-09 MED ORDER — SODIUM CHLORIDE 0.9% IV SOLUTION
Freq: Once | INTRAVENOUS | Status: AC
  Administered 2019-01-09: 11:00:00 via INTRAVENOUS

## 2019-01-09 MED ORDER — VITAMIN D (ERGOCALCIFEROL) 1.25 MG (50000 UNIT) PO CAPS: 50000.0000 [IU] | ORAL_CAPSULE | ORAL | 0 refills | Status: DC

## 2019-01-09 MED ORDER — OMEPRAZOLE 20 MG PO CPDR: 20.0000 mg | DELAYED_RELEASE_CAPSULE | Freq: Every day | ORAL | 0 refills | Status: DC

## 2019-01-09 MED ORDER — VITAMIN D (ERGOCALCIFEROL) 1.25 MG (50000 UNIT) PO CAPS
50000.0000 [IU] | ORAL_CAPSULE | ORAL | Status: DC
  Administered 2019-01-09: 09:00:00 50000 [IU] via ORAL
  Filled 2019-01-09: qty 1

## 2019-01-09 MED ORDER — ACETAMINOPHEN 325 MG PO TABS
650.0000 mg | ORAL_TABLET | Freq: Once | ORAL | Status: AC
  Administered 2019-01-09: 11:00:00 650 mg via ORAL
  Filled 2019-01-09: qty 2

## 2019-01-09 MED ORDER — VITAMIN B-12 1000 MCG PO TABS: 1000.0000 ug | ORAL_TABLET | Freq: Every day | ORAL | 0 refills | Status: DC

## 2019-01-09 NOTE — TOC Progression Note (Signed)
Transition of Care [facility]) - Progression Note    Patient Details  Name: Ryan Matthews MRN: [id number] Date of Birth: [birth year]  [facility] CM/SW Contact  Barrie Dunker, RN Phone Number: 01/09/2019, 11:19 AM  Clinical Narrative:     Provided the patient with a spanish application for [facility]    Barriers to Discharge: Barriers Resolved  Expected Discharge Plan and Services           Expected Discharge Date: 01/09/19                                     Social Determinants of Health (SDOH) Interventions    Readmission Risk Interventions No flowsheet data found.

## 2019-01-09 NOTE — TOC Transition Note (Signed)
Transition of Care (TOC) - CM/SW Discharge Note   Patient Details  Name: Ryan Matthews MRN: [id number] Date of Birth: 01/29/1951  Transition of Care (TOC) CM/SW Contact:   J , RN Phone Number: 01/09/2019, 10:08 AM   Clinical Narrative:     Met with the patient to discuss DC needs had interpreter in the room with em to translate, he will have an oncology appointment made before DC by the unit secretary, The patient does not have a PCP and I sent a referral to [facility] and they will call him with appointment, his medications are all over the counter medications and he states that he can afford them.  He agrees to [facility] and stated that he has no other needs He lives with his brother and has family support  Final next level of care: Home/Self Care Barriers to Discharge: Barriers Resolved   Patient Goals and CMS Choice        Discharge Placement                       Discharge Plan and Services                                     Social Determinants of Health (SDOH) Interventions     Readmission Risk Interventions No flowsheet data found.     

## 2019-01-09 NOTE — Progress Notes (Signed)
Ryan Matthews , MD [address], [address], [place], [place], [address] [address], [address], [place], [place], [address] Phone: [phone]  Fax: [phone]   Ryan Matthews is being followed for anemia. Abnormal LFT's  Day 1 of follow up   Subjective: No complaints   Objective: Vital signs in last 24 hours: Vitals:   01/08/19 1937 01/09/19 0415 01/09/19 0416 01/09/19 0418  BP: 131/85 116/86 120/89 97/71  Pulse: (!) 101 (!) 111 (!) 131 (!) 141  Resp: 16 16 16 18   Temp: 98 F (36.7 C) 98 F (36.7 C) 98.2 F (36.8 C) 98.3 F (36.8 C)  TempSrc: Oral Oral Oral Oral  SpO2: 97% 95% 98% 97%  Weight:      Height:       Weight change:   Intake/Output Summary (Last 24 hours) at 01/09/2019 0944 Last data filed at 01/08/2019 1843 Gross per 24 hour  Intake 120 ml  Output -  Net 120 ml     Exam: Heart:: Regular rate and rhythm, S1S2 present or without murmur or extra heart sounds Lungs: normal, clear to auscultation and clear to auscultation and percussion Abdomen: soft, nontender, normal bowel sounds   Lab Results: @LABTEST2 @ Micro Results: Recent Results (from the past 240 hour(s))  Urine culture     Status: None   Collection Time: 01/06/19  8:53 PM  Result Value Ref Range Status   Specimen Description   Final    URINE, CLEAN CATCH Performed at [facility], [address] [place], [place] [address]    Special Requests   Final    NONE Performed at [facility], [address] [place], [place] [address]    Culture   Final    NO GROWTH Performed at [facility], [address], [place], [place] [address]    Report Status 01/08/2019 FINAL  Final  SARS Coronavirus 2 (CEPHEID - Performed in [facility] hospital lab), Hosp Order     Status: None   Collection Time: 01/07/19  2:17 AM  Result Value Ref Range Status   SARS Coronavirus 2 NEGATIVE NEGATIVE Final    Comment: (NOTE) If result is NEGATIVE SARS-CoV-2 target nucleic acids  are NOT DETECTED. The SARS-CoV-2 RNA is generally detectable in upper and lower  respiratory specimens during the acute phase of infection. The lowest  concentration of SARS-CoV-2 viral copies this assay can detect is 250  copies / mL. A negative result does not preclude SARS-CoV-2 infection  and should not be used as the sole basis for treatment or other  patient management decisions.  A negative result may occur with  improper specimen collection / handling, submission of specimen other  than nasopharyngeal swab, presence of viral mutation(s) within the  areas targeted by this assay, and inadequate number of viral copies  (<250 copies / mL). A negative result must be combined with clinical  observations, patient history, and epidemiological information. If result is POSITIVE SARS-CoV-2 target nucleic acids are DETECTED. The SARS-CoV-2 RNA is generally detectable in upper and lower  respiratory specimens dur ing the acute phase of infection.  Positive  results are indicative of active infection with SARS-CoV-2.  Clinical  correlation with patient history and other diagnostic information is  necessary to determine patient infection status.  Positive results do  not rule out bacterial infection or co-infection with other viruses. If result is PRESUMPTIVE POSTIVE SARS-CoV-2 nucleic acids MAY BE PRESENT.   A presumptive positive result was obtained on  the submitted specimen  and confirmed on repeat testing.  While 2019 novel coronavirus  (SARS-CoV-2) nucleic acids may be present in the submitted sample  additional confirmatory testing may be necessary for epidemiological  and / or clinical management purposes  to differentiate between  SARS-CoV-2 and other Sarbecovirus currently known to infect humans.  If clinically indicated additional testing with an alternate test  methodology [phone]) is advised. The SARS-CoV-2 RNA is generally  detectable in upper and lower respiratory sp ecimens  during the acute  phase of infection. The expected result is Negative. Fact Sheet for Patients:  BoilerBrush.com.cy Fact Sheet for Healthcare Providers: https://pope.com/ This test is not yet approved or cleared by the [place] FDA and has been authorized for detection and/or diagnosis of SARS-CoV-2 by FDA under an Emergency Use Authorization (EUA).  This EUA will remain in effect (meaning this test can be used) for the duration of the COVID-19 declaration under Section 564(b)(1) of the Act, 21 U.S.C. section 360bbb-3(b)(1), unless the authorization is terminated or revoked sooner. Performed at [facility], [address] [place], [place] [address]   CULTURE, BLOOD (ROUTINE X 2) w Reflex to ID Panel     Status: None (Preliminary result)   Collection Time: 01/08/19  9:08 AM  Result Value Ref Range Status   Specimen Description BLOOD RIGHT ANTECUBITAL  Final   Special Requests   Final    BOTTLES DRAWN AEROBIC AND ANAEROBIC Blood Culture adequate volume   Culture   Final    NO GROWTH < 24 HOURS Performed at [facility], [address] [place], [place] [address]    Report Status PENDING  Incomplete  CULTURE, BLOOD (ROUTINE X 2) w Reflex to ID Panel     Status: None (Preliminary result)   Collection Time: 01/08/19  9:22 AM  Result Value Ref Range Status   Specimen Description BLOOD LEFT HAND  Final   Special Requests   Final    BOTTLES DRAWN AEROBIC AND ANAEROBIC Blood Culture adequate volume   Culture   Final    NO GROWTH < 24 HOURS Performed at [facility], [address] [place], [place] [address]    Report Status PENDING  Incomplete   Studies/Results: Ct Abdomen Pelvis W Contrast  Result Date: 01/07/2019 CLINICAL DATA:  Altered mental status. EXAM: CT ABDOMEN AND PELVIS WITH CONTRAST TECHNIQUE: Multidetector CT imaging of the abdomen and pelvis was performed using the standard protocol  following bolus administration of intravenous contrast. CONTRAST:  75mL OMNIPAQUE IOHEXOL 300 MG/ML  SOLN COMPARISON:  Ultrasound 01/06/2019 FINDINGS: Lower chest: Lung bases are clear. Hepatobiliary: No focal hepatic lesion. No biliary duct dilatation. Gallbladder is normal. Common bile duct is normal. Pancreas: Pancreas is normal. No ductal dilatation. No pancreatic inflammation. Spleen: Normal spleen Adrenals/urinary tract: Adrenal glands and kidneys are normal. The ureters and bladder normal. Stomach/Bowel: Stomach duodenum normal. Small bowel normal. Appendix normal. Moderate volume stool in the ascending, transverse and descending colon moderate volume stool in the sigmoid colon rectum. No obstructing lesion identified. Vascular/Lymphatic: Abdominal aorta is normal caliber with atherosclerotic calcification. There is no retroperitoneal or periportal lymphadenopathy. No pelvic lymphadenopathy. Reproductive: Prostate normal Other: No free fluid or abscess in the abdomen pelvis. Musculoskeletal: Bilateral pars defects at L5 with grade 1 anterolisthesis of L5 on S1. IMPRESSION: 1. Moderate volume stool throughout the colon suggests constipation. 2. No evidence of abdominal infection or inflammation. 3. 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   On: 01/07/2019 16:39   Medications: I have reviewed the patient's current medications. Scheduled Meds: . sodium chloride   Intravenous Once  . acetaminophen  650 mg Oral Once  . cyanocobalamin  1,000 mcg Intramuscular Daily  . feeding supplement (ENSURE ENLIVE)  1 Bottle Oral TID BM  . folic acid  1 mg Oral Daily  . furosemide  20 mg Intravenous Once  . multivitamin with minerals  1 tablet Oral Daily  . pantoprazole  40 mg Oral Daily  . sodium chloride flush  3 mL Intravenous Q12H  . thiamine  100 mg Oral Daily  . Vitamin D (Ergocalciferol)  50,000 Units Oral Q7 days   Continuous Infusions:  PRN Meds:.ondansetron **OR** ondansetron (ZOFRAN) IV, polyethylene glycol   Assessment: Active Problems:   Altered mental status  Ryan SayresSergio Wendorf is a 68 y.o. y/o male presented to the hospital with altered mental status.  I have been consulted for a normocytic anemia with normal iron studies and very low B12 levels.  CBC demonstrates a pancytopenia.  Liver function tests are abnormal particularly transaminases and alkaline phosphatase.  No biliary obstruction seen on ultrasound of the liver.  Albumin is low at very low at 2.5.  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 INR 1.2 . He appears very malnourished.    1.  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   2.  Low vitamin D and PTH- replacement needed  3.   thiamine ,   Replace B12. 4.  Blood in urine seen would need further evaluation  5. LFT's have improved today      LOS: 2 days   Ryan MoodKiran Mylasia Vorhees, MD 01/09/2019, 9:44 AM

## 2019-01-09 NOTE — Discharge Summary (Signed)
[place] [facility] [place] NAME: Ryan Matthews    MR#:  [id number]  DATE OF BIRTH:  12-18-1950  DATE OF ADMISSION:  01/06/2019 ADMITTING PHYSICIAN: Sela Hua, MD  DATE OF DISCHARGE: 01/09/2019  PRIMARY CARE PHYSICIAN: Patient, No Pcp Per    ADMISSION DIAGNOSIS:  Altered mental status, unspecified altered mental status type [R41.82]  DISCHARGE DIAGNOSIS:  Active Problems:   Altered mental status   SECONDARY DIAGNOSIS:   Past Medical History:  Diagnosis Date  . Dementia ([place])   . Hypertension     HOSPITAL COURSE:   1.  Pancytopenia.   Patient seen by Dr. Tasia Catchings hematology and she recommended replacing B12 first then rechecking CBC as outpatient.  If still pancytopenic after replacing B12 then may need a bone marrow biopsy.  We have seen pancytopenia with B12 deficiency.  This could also be myelodysplastic syndrome but bone marrow biopsy would be needed to help out with this diagnosis. 2.  Anemia.  Symptomatic with weakness.  Hemoglobin 7.2 and this drifted down to this level with IV fluid hydration.  I will give a unit of blood prior to discharge and follow-up counts as outpatient.  Patient was trace guaiac positive.  GI wanted to hold off on further work-up at this point. 3.  B12 deficiency.  The patient was given IM B12 shots 1000 mg daily x3.  Will give oral supplementation upon discharge.  Will need IM injections as outpatient which can either be set up through the hematology clinic or the open-door clinic. 4.  Elevated liver function test.  Abdominal ultrasound negative.  CT scan of the abdomen negative.  Hepatitis profiles negative.  Liver function slowly trending better. 5.  Protein calorie malnutrition.  Started on Ensure 6.  Orthostatic hypotension.  I held atenolol.  TED hose. 7.  Weakness.  Physical therapy 8.  Vitamin D deficiency vitamin D also prescribed. 9.  Skin changes on his fingers could be rainouts phenomenon.  I did get  a set of blood cultures which are negative so far to rule out infection.  Ordered an ANA, cryoglobulins which are still pending 10.  Acute encephalopathy.  This has improved.  Patient answering questions.  And history patient does have an underlying dementia.  DISCHARGE CONDITIONS:   Fair  CONSULTS OBTAINED:  Treatment Team:  Jonathon Bellows, MD Earlie Server, MD  DRUG ALLERGIES:  No Known Allergies  DISCHARGE MEDICATIONS:   Allergies as of 01/09/2019   No Known Allergies     Medication List    TAKE these medications   feeding supplement (ENSURE ENLIVE) Liqd Take 237 mLs by mouth 3 (three) times daily between meals.   omeprazole 20 MG capsule Commonly known as:  PriLOSEC Take 1 capsule (20 mg total) by mouth daily.   vitamin B-12 1000 MCG tablet Commonly known as:  CYANOCOBALAMIN Take 1 tablet (1,000 mcg total) by mouth daily.   Vitamin D (Ergocalciferol) 1.25 MG (50000 UT) Caps capsule Commonly known as:  DRISDOL Take 1 capsule (50,000 Units total) by mouth every 7 (seven) days.            Durable Medical Equipment  (From admission, onward)         Start     Ordered   01/09/19 0820  For home use only DME Walker rolling  Once    Question:  Patient needs a walker to treat with the following condition  Answer:  Unsteady gait   01/09/19 0819  DISCHARGE INSTRUCTIONS:   Follow-up hematology 2 weeks Follow-up open-door clinic 2 weeks  If you experience worsening of your admission symptoms, develop shortness of breath, life threatening emergency, suicidal or homicidal thoughts you must seek medical attention immediately by calling 911 or calling your MD immediately  if symptoms less severe.  You Must read complete instructions/literature along with all the possible adverse reactions/side effects for all the Medicines you take and that have been prescribed to you. Take any new Medicines after you have completely understood and accept all the possible adverse  reactions/side effects.   Please note  You were cared for by a hospitalist during your hospital stay. If you have any questions about your discharge medications or the care you received while you were in the hospital after you are discharged, you can call the unit and asked to speak with the hospitalist on call if the hospitalist that took care of you is not available. Once you are discharged, your primary care physician will handle any further medical issues. Please note that NO REFILLS for any discharge medications will be authorized once you are discharged, as it is imperative that you return to your primary care physician (or establish a relationship with a primary care physician if you do not have one) for your aftercare needs so that they can reassess your need for medications and monitor your lab values.    Today   CHIEF COMPLAINT:   Chief Complaint  Patient presents with  . Altered Mental Status    HISTORY OF PRESENT ILLNESS:  Ryan Matthews  is a 68 y.o. male patient was brought in with altered mental status   VITAL SIGNS:  Blood pressure 111/80, pulse (!) 102, temperature 98.4 F (36.9 C), temperature source Oral, resp. rate 16, height _0  (1.727 m), weight 53.5 kg, SpO2 100 %.  I/O:    Intake/Output Summary (Last 24 hours) at 01/09/2019 1259 Last data filed at 01/09/2019 0900 Gross per 24 hour  Intake 240 ml  Output -  Net 240 ml    PHYSICAL EXAMINATION:  GENERAL:  68 y.o.-year-old patient lying in the bed with no acute distress.  EYES: Pupils equal, round, reactive to light and accommodation. No scleral icterus. Extraocular muscles intact.  HEENT: Head atraumatic, normocephalic. Oropharynx and nasopharynx clear.  NECK:  Supple, no jugular venous distention. No thyroid enlargement, no tenderness.  LUNGS: Normal breath sounds bilaterally, no wheezing, rales,rhonchi or crepitation. No use of accessory muscles of respiration.  CARDIOVASCULAR: S1, S2 normal. No  murmurs, rubs, or gallops.  ABDOMEN: Soft, non-tender, non-distended. Bowel sounds present. No organomegaly or mass.  EXTREMITIES: No pedal edema, cyanosis, or clubbing.  NEUROLOGIC: Cranial nerves II through XII are intact. Muscle strength 5/5 in all extremities. Sensation intact. Gait not checked.  PSYCHIATRIC: The patient is alert and answers questions through interpreter well.  SKIN: No obvious rash, lesion, or ulcer.   DATA REVIEW:   CBC Recent Labs  Lab 01/09/19 0352  WBC 2.6*  HGB 7.2*  HCT 23.9*  PLT 89*    Chemistries  Recent Labs  Lab 01/07/19 0615  01/09/19 0352  NA  --   --  136  K  --   --  3.6  CL  --   --  112*  CO2  --   --  20*  GLUCOSE  --   --  88  BUN  --   --  15  CREATININE  --   --  0.84  CALCIUM  --    < >  7.1*  MG 2.1  --   --   AST  --   --  117*  ALT  --   --  56*  ALKPHOS  --   --  151*  BILITOT  --   --  0.2*   < > = values in this interval not displayed.     Microbiology Results  Results for orders placed or performed during the hospital encounter of 01/06/19  Urine culture     Status: None   Collection Time: 01/06/19  8:53 PM  Result Value Ref Range Status   Specimen Description   Final    URINE, CLEAN CATCH Performed at [facility], [address], [place], [place] [address]    Special Requests   Final    NONE Performed at [facility], [address], [place], [place] [address]    Culture   Final    NO GROWTH Performed at [facility], [place] [address], [place], [place] [address]    Report Status 01/08/2019 FINAL  Final  SARS Coronavirus 2 (CEPHEID - Performed in [facility] hospital lab), Hosp Order     Status: None   Collection Time: 01/07/19  2:17 AM  Result Value Ref Range Status   SARS Coronavirus 2 NEGATIVE NEGATIVE Final    Comment: (NOTE) If result is NEGATIVE SARS-CoV-2 target nucleic acids are NOT DETECTED. The SARS-CoV-2 RNA is generally detectable in upper and lower  respiratory  specimens during the acute phase of infection. The lowest  concentration of SARS-CoV-2 viral copies this assay can detect is 250  copies / mL. A negative result does not preclude SARS-CoV-2 infection  and should not be used as the sole basis for treatment or other  patient management decisions.  A negative result may occur with  improper specimen collection / handling, submission of specimen other  than nasopharyngeal swab, presence of viral mutation(s) within the  areas targeted by this assay, and inadequate number of viral copies  (<250 copies / mL). A negative result must be combined with clinical  observations, patient history, and epidemiological information. If result is POSITIVE SARS-CoV-2 target nucleic acids are DETECTED. The SARS-CoV-2 RNA is generally detectable in upper and lower  respiratory specimens dur ing the acute phase of infection.  Positive  results are indicative of active infection with SARS-CoV-2.  Clinical  correlation with patient history and other diagnostic information is  necessary to determine patient infection status.  Positive results do  not rule out bacterial infection or co-infection with other viruses. If result is PRESUMPTIVE POSTIVE SARS-CoV-2 nucleic acids MAY BE PRESENT.   A presumptive positive result was obtained on the submitted specimen  and confirmed on repeat testing.  While 2019 novel coronavirus  (SARS-CoV-2) nucleic acids may be present in the submitted sample  additional confirmatory testing may be necessary for epidemiological  and / or clinical management purposes  to differentiate between  SARS-CoV-2 and other Sarbecovirus currently known to infect humans.  If clinically indicated additional testing with an alternate test  methodology [phone]) is advised. The SARS-CoV-2 RNA is generally  detectable in upper and lower respiratory sp ecimens during the acute  phase of infection. The expected result is Negative. Fact Sheet for  Patients:  StrictlyIdeas.no Fact Sheet for Healthcare Providers: BankingDealers.co.za This test is not yet approved or cleared by the [place] FDA and has been authorized for detection and/or diagnosis of SARS-CoV-2 by FDA under an Emergency Use Authorization (EUA).  This  EUA will remain in effect (meaning this test can be used) for the duration of the COVID-19 declaration under Section 564(b)(1) of the Act, 21 U.S.C. section 360bbb-3(b)(1), unless the authorization is terminated or revoked sooner. Performed at [facility], [address] [place], [place] [address]   CULTURE, BLOOD (ROUTINE X 2) w Reflex to ID Panel     Status: None (Preliminary result)   Collection Time: 01/08/19  9:08 AM  Result Value Ref Range Status   Specimen Description BLOOD RIGHT ANTECUBITAL  Final   Special Requests   Final    BOTTLES DRAWN AEROBIC AND ANAEROBIC Blood Culture adequate volume   Culture   Final    NO GROWTH < 24 HOURS Performed at [facility], [address] [place], [place] [address]    Report Status PENDING  Incomplete  CULTURE, BLOOD (ROUTINE X 2) w Reflex to ID Panel     Status: None (Preliminary result)   Collection Time: 01/08/19  9:22 AM  Result Value Ref Range Status   Specimen Description BLOOD LEFT HAND  Final   Special Requests   Final    BOTTLES DRAWN AEROBIC AND ANAEROBIC Blood Culture adequate volume   Culture   Final    NO GROWTH < 24 HOURS Performed at [facility], [address], [place], [place] [address]    Report Status PENDING  Incomplete    RADIOLOGY:  Ct Abdomen Pelvis W Contrast  Result Date: 01/07/2019 CLINICAL DATA:  Altered mental status. EXAM: CT ABDOMEN AND PELVIS WITH CONTRAST TECHNIQUE: Multidetector CT imaging of the abdomen and pelvis was performed using the standard protocol following bolus administration of intravenous contrast. CONTRAST:  51m OMNIPAQUE IOHEXOL 300  MG/ML  SOLN COMPARISON:  Ultrasound 01/06/2019 FINDINGS: Lower chest: Lung bases are clear. Hepatobiliary: No focal hepatic lesion. No biliary duct dilatation. Gallbladder is normal. Common bile duct is normal. Pancreas: Pancreas is normal. No ductal dilatation. No pancreatic inflammation. Spleen: Normal spleen Adrenals/urinary tract: Adrenal glands and kidneys are normal. The ureters and bladder normal. Stomach/Bowel: Stomach duodenum normal. Small bowel normal. Appendix normal. Moderate volume stool in the ascending, transverse and descending colon moderate volume stool in the sigmoid colon rectum. No obstructing lesion identified. Vascular/Lymphatic: Abdominal aorta is normal caliber with atherosclerotic calcification. There is no retroperitoneal or periportal lymphadenopathy. No pelvic lymphadenopathy. Reproductive: Prostate normal Other: No free fluid or abscess in the abdomen pelvis. Musculoskeletal: Bilateral pars defects at L5 with grade 1 anterolisthesis of L5 on S1. IMPRESSION: 1. Moderate volume stool throughout the colon suggests constipation. 2. No evidence of abdominal infection or inflammation. 3. 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   On: 01/07/2019 16:39    Management plans discussed with the patient, family and they are in agreement.  CODE STATUS:     Code Status Orders  (From admission, onward)         Start     Ordered   01/07/19 0318  Full code  Continuous     01/07/19 0317        Code Status History    This patient has a current code status but no historical code status.      TOTAL TIME TAKING CARE OF THIS PATIENT: 35 minutes.    RLoletha GrayerM.D on 01/09/2019 at 12:59 PM  Between 7am to 6pm - Pager - [phone] After 6pm go to [email or web address] - [facility]  Office  [phone]  CC: Primary care physician; [facility]

## 2019-01-09 NOTE — Progress Notes (Signed)
Patient discharged home per MD order. All discharge instructions given using interpreter and all questions answered.

## 2019-01-09 NOTE — Progress Notes (Signed)
Orthostatic blood pressure performed. During sleep heart rate is 96. Lying awake heart rate was 111. Sitting heart rate was 131. Standing heart rate was 141.   Junie Spencer, RN

## 2019-01-10 ENCOUNTER — Other Ambulatory Visit: Payer: Self-pay

## 2019-01-10 DIAGNOSIS — E538 Deficiency of other specified B group vitamins: Secondary | ICD-10-CM

## 2019-01-10 DIAGNOSIS — D61818 Other pancytopenia: Secondary | ICD-10-CM

## 2019-01-10 LAB — ENA+DNA/DS+ANTICH+CENTRO+JO...
Anti JO-1: <0.2 AI (ref 0.0–0.9)
Centromere Ab Screen: <0.2 AI (ref 0.0–0.9)
Chromatin Ab SerPl-aCnc: >8 AI — ABNORMAL HIGH (ref 0.0–0.9)
ENA SM Ab Ser-aCnc: <0.2 AI (ref 0.0–0.9)
Ribonucleic Protein: 0.4 AI (ref 0.0–0.9)
SSA (Ro) (ENA) Antibody, IgG: <0.2 AI (ref 0.0–0.9)
SSB (La) (ENA) Antibody, IgG: <0.2 AI (ref 0.0–0.9)
Scleroderma (Scl-70) (ENA) Antibody, IgG: 0.3 AI (ref 0.0–0.9)
ds DNA Ab: 58 IU/mL — ABNORMAL HIGH (ref 0–9)

## 2019-01-10 LAB — COPPER, SERUM: Copper: 79 ug/dL (ref 72–166)

## 2019-01-10 LAB — KAPPA/LAMBDA LIGHT CHAINS
Kappa free light chain: 287.4 mg/L — ABNORMAL HIGH (ref 3.3–19.4)
Kappa, lambda light chain ratio: 0.91 (ref 0.26–1.65)
Lambda free light chains: 315.6 mg/L — ABNORMAL HIGH (ref 5.7–26.3)

## 2019-01-10 LAB — BPAM RBC
Blood Product Expiration Date: 202006272359
ISSUE DATE / TIME: 202005281131
Unit Type and Rh: 5100

## 2019-01-10 LAB — MULTIPLE MYELOMA PANEL, SERUM
Albumin SerPl Elph-Mcnc: 2.1 g/dL — ABNORMAL LOW (ref 2.9–4.4)
Albumin/Glob SerPl: 0.5 — ABNORMAL LOW (ref 0.7–1.7)
Alpha 1: 0.2 g/dL (ref 0.0–0.4)
Alpha2 Glob SerPl Elph-Mcnc: 0.4 g/dL (ref 0.4–1.0)
B-Globulin SerPl Elph-Mcnc: 1.1 g/dL (ref 0.7–1.3)
Gamma Glob SerPl Elph-Mcnc: 2.7 g/dL — ABNORMAL HIGH (ref 0.4–1.8)
Globulin, Total: 4.4 g/dL — ABNORMAL HIGH (ref 2.2–3.9)
IgA: 1187 mg/dL — ABNORMAL HIGH (ref 61–437)
IgG (Immunoglobin G), Serum: 2722 mg/dL — ABNORMAL HIGH (ref 603–1613)
IgM (Immunoglobulin M), Srm: 65 mg/dL (ref 20–172)
Total Protein ELP: 6.5 g/dL (ref 6.0–8.5)

## 2019-01-10 LAB — ANA W/REFLEX IF POSITIVE: Anti Nuclear Antibody (ANA): POSITIVE — AB

## 2019-01-10 LAB — VITAMIN C: Vitamin C: 1.4 mg/dL (ref 0.4–2.0)

## 2019-01-10 LAB — TYPE AND SCREEN
ABO/RH(D): O POS
Antibody Screen: NEGATIVE
Unit division: 0

## 2019-01-10 LAB — ANTI-PARIETAL ANTIBODY: Parietal Cell Antibody-IgG: 30.2 Units — ABNORMAL HIGH (ref 0.0–20.0)

## 2019-01-10 LAB — VITAMIN K1, SERUM: VITAMIN K1: 0.23 ng/mL (ref 0.13–1.88)

## 2019-01-10 LAB — INTRINSIC FACTOR ANTIBODIES: Intrinsic Factor: 19.5 AU/mL — ABNORMAL HIGH (ref 0.0–1.1)

## 2019-01-10 LAB — ALPHA-1 ANTITRYPSIN PHENOTYPE: A-1 Antitrypsin, Ser: 108 mg/dL (ref 101–187)

## 2019-01-10 LAB — IMMUNOGLOBULINS A/E/G/M, SERUM
IgA: 1197 mg/dL — ABNORMAL HIGH (ref 61–437)
IgE (Immunoglobulin E), Serum: 3637 IU/mL — ABNORMAL HIGH (ref 6–495)
IgG (Immunoglobin G), Serum: 2682 mg/dL — ABNORMAL HIGH (ref 603–1613)
IgM (Immunoglobulin M), Srm: 67 mg/dL (ref 20–172)

## 2019-01-11 LAB — VITAMIN A: Vitamin A (Retinoic Acid): 11.2 ug/dL — ABNORMAL LOW (ref 22.0–69.5)

## 2019-01-13 LAB — COMP PANEL: LEUKEMIA/LYMPHOMA

## 2019-01-13 LAB — CULTURE, BLOOD (ROUTINE X 2)
Culture: NO GROWTH
Culture: NO GROWTH
Special Requests: ADEQUATE
Special Requests: ADEQUATE

## 2019-01-14 ENCOUNTER — Other Ambulatory Visit: Payer: Self-pay

## 2019-01-15 ENCOUNTER — Inpatient Hospital Stay: Payer: Self-pay | Admitting: Oncology

## 2019-01-15 ENCOUNTER — Inpatient Hospital Stay: Payer: Self-pay

## 2019-01-20 LAB — CRYOGLOBULIN

## 2019-01-20 LAB — IMMUNOFIXATION ELECT: Cryoglobulin %: <1 % — ABNORMAL HIGH

## 2019-01-22 ENCOUNTER — Inpatient Hospital Stay: Payer: Self-pay | Admitting: Oncology

## 2019-01-22 ENCOUNTER — Inpatient Hospital Stay: Payer: Self-pay

## 2019-01-22 ENCOUNTER — Inpatient Hospital Stay: Payer: Self-pay | Attending: Oncology

## 2019-01-23 ENCOUNTER — Ambulatory Visit: Payer: Self-pay | Admitting: Oncology

## 2019-01-23 NOTE — TOC Progression Note (Signed)
Transition of Care [facility]) - Progression Note    Patient Details  Name: Ryan Matthews MRN: [id number] Date of Birth: 1950/12/20  Transition of Care [facility]) CM/SW [place], RN Phone Number: 01/23/2019, 1:50 PM  Clinical Narrative:     Was notified by Spanish Interpreter from [facility] [employer] that she was trying to reach the patient to set up appointment for [facility] and was unable to reach him, After many tries the brother was able to provide the information that the patient was frustrated with his health issues and went back to his home country [place].      Barriers to Discharge: Barriers Resolved  Expected Discharge Plan and Services           Expected Discharge Date: 01/09/19                                     Social Determinants of Health (SDOH) Interventions    Readmission Risk Interventions No flowsheet data found.

## 2019-02-12 ENCOUNTER — Inpatient Hospital Stay
Admission: EM | Admit: 2019-02-12 | Discharge: 2019-02-20 | DRG: 871 | Disposition: A | Discharge status: Hospice | Payer: Medicaid Other | Attending: Internal Medicine | Admitting: Internal Medicine

## 2019-02-12 ENCOUNTER — Emergency Department: Payer: Medicaid Other

## 2019-02-12 ENCOUNTER — Other Ambulatory Visit: Payer: Self-pay

## 2019-02-12 DIAGNOSIS — E54 Ascorbic acid deficiency: Secondary | ICD-10-CM | POA: Diagnosis present

## 2019-02-12 DIAGNOSIS — S065X9A Traumatic subdural hemorrhage with loss of consciousness of unspecified duration, initial encounter: Secondary | ICD-10-CM

## 2019-02-12 DIAGNOSIS — R768 Other specified abnormal immunological findings in serum: Secondary | ICD-10-CM | POA: Diagnosis present

## 2019-02-12 DIAGNOSIS — Z515 Encounter for palliative care: Secondary | ICD-10-CM | POA: Diagnosis not present

## 2019-02-12 DIAGNOSIS — Z20828 Contact with and (suspected) exposure to other viral communicable diseases: Secondary | ICD-10-CM | POA: Diagnosis present

## 2019-02-12 DIAGNOSIS — Y95 Nosocomial condition: Secondary | ICD-10-CM | POA: Diagnosis present

## 2019-02-12 DIAGNOSIS — J9601 Acute respiratory failure with hypoxia: Secondary | ICD-10-CM | POA: Diagnosis present

## 2019-02-12 DIAGNOSIS — R627 Adult failure to thrive: Secondary | ICD-10-CM | POA: Diagnosis present

## 2019-02-12 DIAGNOSIS — S065XAA Traumatic subdural hemorrhage with loss of consciousness status unknown, initial encounter: Secondary | ICD-10-CM

## 2019-02-12 DIAGNOSIS — G9341 Metabolic encephalopathy: Secondary | ICD-10-CM | POA: Diagnosis present

## 2019-02-12 DIAGNOSIS — F32A Depression, unspecified: Secondary | ICD-10-CM | POA: Diagnosis present

## 2019-02-12 DIAGNOSIS — F329 Major depressive disorder, single episode, unspecified: Secondary | ICD-10-CM | POA: Diagnosis present

## 2019-02-12 DIAGNOSIS — Z66 Do not resuscitate: Secondary | ICD-10-CM | POA: Diagnosis not present

## 2019-02-12 DIAGNOSIS — I10 Essential (primary) hypertension: Secondary | ICD-10-CM | POA: Diagnosis present

## 2019-02-12 DIAGNOSIS — E876 Hypokalemia: Secondary | ICD-10-CM | POA: Diagnosis not present

## 2019-02-12 DIAGNOSIS — J189 Pneumonia, unspecified organism: Secondary | ICD-10-CM | POA: Diagnosis present

## 2019-02-12 DIAGNOSIS — F0390 Unspecified dementia without behavioral disturbance: Secondary | ICD-10-CM | POA: Diagnosis present

## 2019-02-12 DIAGNOSIS — R64 Cachexia: Secondary | ICD-10-CM | POA: Diagnosis present

## 2019-02-12 DIAGNOSIS — Z23 Encounter for immunization: Secondary | ICD-10-CM

## 2019-02-12 DIAGNOSIS — M7989 Other specified soft tissue disorders: Secondary | ICD-10-CM

## 2019-02-12 DIAGNOSIS — Z79899 Other long term (current) drug therapy: Secondary | ICD-10-CM

## 2019-02-12 DIAGNOSIS — Z681 Body mass index (BMI) 19 or less, adult: Secondary | ICD-10-CM

## 2019-02-12 DIAGNOSIS — I6200 Nontraumatic subdural hemorrhage, unspecified: Secondary | ICD-10-CM | POA: Diagnosis present

## 2019-02-12 DIAGNOSIS — A419 Sepsis, unspecified organism: Principal | ICD-10-CM | POA: Diagnosis present

## 2019-02-12 DIAGNOSIS — D638 Anemia in other chronic diseases classified elsewhere: Secondary | ICD-10-CM | POA: Diagnosis present

## 2019-02-12 DIAGNOSIS — D61818 Other pancytopenia: Secondary | ICD-10-CM | POA: Diagnosis present

## 2019-02-12 DIAGNOSIS — E43 Unspecified severe protein-calorie malnutrition: Secondary | ICD-10-CM | POA: Diagnosis present

## 2019-02-12 DIAGNOSIS — R740 Nonspecific elevation of levels of transaminase and lactic acid dehydrogenase [LDH]: Secondary | ICD-10-CM | POA: Diagnosis present

## 2019-02-12 DIAGNOSIS — R3129 Other microscopic hematuria: Secondary | ICD-10-CM | POA: Diagnosis not present

## 2019-02-12 LAB — URINALYSIS, ROUTINE W REFLEX MICROSCOPIC
Bacteria, UA: NONE SEEN
Bilirubin Urine: NEGATIVE
Glucose, UA: NEGATIVE mg/dL
Ketones, ur: NEGATIVE mg/dL
Leukocytes,Ua: NEGATIVE
Nitrite: NEGATIVE
Protein, ur: 100 mg/dL — AB
Specific Gravity, Urine: 1.02 (ref 1.005–1.030)
pH: 5 (ref 5.0–8.0)

## 2019-02-12 LAB — LACTIC ACID, PLASMA: Lactic Acid, Venous: 2 mmol/L (ref 0.5–1.9)

## 2019-02-12 LAB — CBC WITH DIFFERENTIAL/PLATELET
Abs Immature Granulocytes: 0.03 10*3/uL (ref 0.00–0.07)
Basophils Absolute: 0 10*3/uL (ref 0.0–0.1)
Basophils Relative: 0 %
Eosinophils Absolute: 0 10*3/uL (ref 0.0–0.5)
Eosinophils Relative: 0 %
HCT: 25.5 % — ABNORMAL LOW (ref 39.0–52.0)
Hemoglobin: 8.1 g/dL — ABNORMAL LOW (ref 13.0–17.0)
Immature Granulocytes: 1 %
Lymphocytes Relative: 9 %
Lymphs Abs: 0.5 10*3/uL — ABNORMAL LOW (ref 0.7–4.0)
MCH: 27.5 pg (ref 26.0–34.0)
MCHC: 31.8 g/dL (ref 30.0–36.0)
MCV: 86.4 fL (ref 80.0–100.0)
Monocytes Absolute: 0.5 10*3/uL (ref 0.1–1.0)
Monocytes Relative: 8 %
Neutro Abs: 5.1 10*3/uL (ref 1.7–7.7)
Neutrophils Relative %: 82 %
Platelets: 79 10*3/uL — ABNORMAL LOW (ref 150–400)
RBC: 2.95 MIL/uL — ABNORMAL LOW (ref 4.22–5.81)
RDW: 19.5 % — ABNORMAL HIGH (ref 11.5–15.5)
Smear Review: DECREASED
WBC Morphology: INCREASED
WBC: 6.1 10*3/uL (ref 4.0–10.5)
nRBC: 0 % (ref 0.0–0.2)

## 2019-02-12 LAB — COMPREHENSIVE METABOLIC PANEL
ALT: 39 U/L (ref 0–44)
AST: 84 U/L — ABNORMAL HIGH (ref 15–41)
Albumin: 1.6 g/dL — ABNORMAL LOW (ref 3.5–5.0)
Alkaline Phosphatase: 246 U/L — ABNORMAL HIGH (ref 38–126)
Anion gap: 6 (ref 5–15)
BUN: 15 mg/dL (ref 8–23)
CO2: 19 mmol/L — ABNORMAL LOW (ref 22–32)
Calcium: 7.1 mg/dL — ABNORMAL LOW (ref 8.9–10.3)
Chloride: 111 mmol/L (ref 98–111)
Creatinine, Ser: 0.9 mg/dL (ref 0.61–1.24)
GFR calc Af Amer: >60 mL/min (ref >60)
GFR calc non Af Amer: >60 mL/min (ref >60)
Glucose, Bld: 98 mg/dL (ref 70–99)
Potassium: 3.8 mmol/L (ref 3.5–5.1)
Sodium: 136 mmol/L (ref 135–145)
Total Bilirubin: 0.7 mg/dL (ref 0.3–1.2)
Total Protein: 6.8 g/dL (ref 6.5–8.1)

## 2019-02-12 LAB — PROCALCITONIN: Procalcitonin: 0.61 ng/mL

## 2019-02-12 LAB — PROTIME-INR
INR: 1.3 — ABNORMAL HIGH (ref 0.8–1.2)
Prothrombin Time: 15.7 seconds — ABNORMAL HIGH (ref 11.4–15.2)

## 2019-02-12 LAB — LIPASE, BLOOD: Lipase: 28 U/L (ref 11–51)

## 2019-02-12 LAB — SARS CORONAVIRUS 2 BY RT PCR (HOSPITAL ORDER, PERFORMED IN ~~LOC~~ HOSPITAL LAB): SARS Coronavirus 2: NEGATIVE

## 2019-02-12 LAB — AMMONIA: Ammonia: 14 umol/L (ref 9–35)

## 2019-02-12 MED ORDER — VANCOMYCIN HCL IN DEXTROSE 1-5 GM/200ML-% IV SOLN: 1000.0000 mg | Freq: Once | INTRAVENOUS | Status: DC

## 2019-02-12 MED ORDER — SODIUM CHLORIDE 0.9 % IV SOLN
2.0000 g | Freq: Once | INTRAVENOUS | Status: AC
  Administered 2019-02-12: 22:00:00 2 g via INTRAVENOUS
  Filled 2019-02-12: qty 2

## 2019-02-12 MED ORDER — ACETAMINOPHEN 500 MG PO TABS: 1000.0000 mg | ORAL_TABLET | ORAL | Status: DC

## 2019-02-12 MED ORDER — VANCOMYCIN HCL 1.25 G IV SOLR
1250.0000 mg | Freq: Once | INTRAVENOUS | Status: AC
  Administered 2019-02-12: 22:00:00 1250 mg via INTRAVENOUS
  Filled 2019-02-12: qty 1250

## 2019-02-12 MED ORDER — SODIUM CHLORIDE 0.9 % IV BOLUS
1000.0000 mL | Freq: Once | INTRAVENOUS | Status: AC
  Administered 2019-02-12: 21:00:00 1000 mL via INTRAVENOUS

## 2019-02-12 NOTE — ED Provider Notes (Signed)
[facility] Emergency Department Provider Note ____________________________________________   First MD Initiated Contact with Patient 02/12/19 2057     (approximate)  I have reviewed the triage vital signs and the nursing notes.   HISTORY  Chief Complaint Code Sepsis  Spanish interpreter utilized,  EM caveat:-Interpreter reports patient not answering questions appropriately does not seem to understand what he saying  HPI Ryan Matthews is a 68 y.o. male   history of dementia also previous pancytopenia recent admission  Patient presents for evaluation for confusion.  See note below discussed with the patient's brother presentation today with Spanish interpreter  Past Medical History:  Diagnosis Date  . Dementia ([place])   . Hypertension   . Other pancytopenia ([place]) 01/09/2019    Patient Active Problem List   Diagnosis Date Noted  . Other pancytopenia ([place]) 01/09/2019  . Vitamin B12 deficiency 01/09/2019  . Altered mental status 01/07/2019    History reviewed. No pertinent surgical history.  Prior to Admission medications   Medication Sig Start Date End Date Taking? Authorizing Provider  feeding supplement, ENSURE ENLIVE, (ENSURE ENLIVE) LIQD Take 237 mLs by mouth 3 (three) times daily between meals. 01/09/19   Loletha Grayer, MD  omeprazole (PRILOSEC) 20 MG capsule Take 1 capsule (20 mg total) by mouth daily. 01/09/19 01/09/20  Loletha Grayer, MD  vitamin B-12 (CYANOCOBALAMIN) 1000 MCG tablet Take 1 tablet (1,000 mcg total) by mouth daily. 01/09/19   Loletha Grayer, MD  Vitamin D, Ergocalciferol, (DRISDOL) 1.25 MG (50000 UT) CAPS capsule Take 1 capsule (50,000 Units total) by mouth every 7 (seven) days. 01/09/19   Loletha Grayer, MD    Allergies Patient has no known allergies.  No family history on file.  Social History Social History   Tobacco Use  . Smoking status: Not on file  Substance Use Topics  . Alcohol use: Not Currently  .  Drug use: Not Currently    Review of Systems EM caveat   ____________________________________________   PHYSICAL EXAM:  VITAL SIGNS: ED Triage Vitals  Enc Vitals Group     BP 02/12/19 2100 (!) 128/96     Pulse Rate 02/12/19 2100 (!) 126     Resp 02/12/19 2100 19     Temp 02/12/19 2133 (!) 101.1 F (38.4 C)     Temp Source 02/12/19 2133 Rectal     SpO2 02/12/19 2100 96 %     Weight 02/12/19 2100 117 lb 15.1 oz (53.5 kg)     Height --      Head Circumference --      Peak Flow --      Pain Score --      Pain Loc --      Pain Edu? --      Excl. in [place]? --     Constitutional: Alert and moderately disoriented.  Does not speak when spoken to.  Calm somewhat sedate but sits up and alert. Eyes: Conjunctivae are normal. Head: Atraumatic. Nose: No congestion/rhinnorhea. Mouth/Throat: Mucous membranes are moist. Neck: No stridor.  Cardiovascular: Normal rate, regular rhythm. Grossly normal heart sounds.  Good peripheral circulation. Respiratory: Normal respiratory effort.  No retractions. Lungs CTAB. Gastrointestinal: Soft and nontender. No distention. Musculoskeletal: No lower extremity tenderness nor edema. Neurologic:  Normal speech and language. No gross focal neurologic deficits are appreciated.  Skin:  Skin is warm, dry and intact. No rash noted. Psychiatric: Mood and affect are normal. Speech and behavior are normal.  ____________________________________________   LABS (all labs ordered  are listed, but only abnormal results are displayed)  Labs Reviewed  LACTIC ACID, PLASMA - Abnormal; Notable for the following components:      Result Value   Lactic Acid, Venous 2.0 (*)    All other components within normal limits  COMPREHENSIVE METABOLIC PANEL - Abnormal; Notable for the following components:   CO2 19 (*)    Calcium 7.1 (*)    Albumin 1.6 (*)    AST 84 (*)    Alkaline Phosphatase 246 (*)    All other components within normal limits  CBC WITH  DIFFERENTIAL/PLATELET - Abnormal; Notable for the following components:   RBC 2.95 (*)    Hemoglobin 8.1 (*)    HCT 25.5 (*)    RDW 19.5 (*)    Platelets 79 (*)    Lymphs Abs 0.5 (*)    All other components within normal limits  PROTIME-INR - Abnormal; Notable for the following components:   Prothrombin Time 15.7 (*)    INR 1.3 (*)    All other components within normal limits  URINALYSIS, ROUTINE W REFLEX MICROSCOPIC - Abnormal; Notable for the following components:   Color, Urine AMBER (*)    APPearance HAZY (*)    Hgb urine dipstick LARGE (*)    Protein, ur 100 (*)    All other components within normal limits  SARS CORONAVIRUS 2 (HOSPITAL ORDER, PERFORMED IN [facility])  CULTURE, BLOOD (ROUTINE X 2)  CULTURE, BLOOD (ROUTINE X 2)  LIPASE, BLOOD  PROCALCITONIN  AMMONIA  LACTIC ACID, PLASMA   ____________________________________________  EKG   ____________________________________________  RADIOLOGY  Ct Head Wo Contrast  Result Date: 02/12/2019 CLINICAL DATA:  Altered mental status EXAM: CT HEAD WITHOUT CONTRAST TECHNIQUE: Contiguous axial images were obtained from the base of the skull through the vertex without intravenous contrast. COMPARISON:  01/06/2019 FINDINGS: Brain: There is a midline subdural hematoma along the posterior aspect of the falx, new since prior study. This likely also along the left side of the tentorium. Maximum thickness along the falx is 6 mm. No mass effect or midline shift. No acute infarct or hydrocephalus. Vascular: No hyperdense vessel or unexpected calcification. Skull: No acute calvarial abnormality. Sinuses/Orbits: Visualized paranasal sinuses and mastoids clear. Orbital soft tissues unremarkable. Other: None IMPRESSION: 6 mm parafalcine subdural hemorrhage along the posterior falx and the left tentorium. No mass effect or midline shift. These results were called by telephone at the time of interpretation on 02/12/2019 at 10:58 pm to Dr.  Sharyn CreamerMARK QUALE , who verbally acknowledged these results. Electronically Signed   By: Charlett NoseKevin  Dover M.D.   On: 02/12/2019 23:00   Dg Chest Portable 1 View  Result Date: 02/12/2019 CLINICAL DATA:  Fever and cough EXAM: PORTABLE CHEST 1 VIEW COMPARISON:  01/07/2019 FINDINGS: Cardiac shadows within normal limits. The lungs are well aerated bilaterally. Patchy infiltrate is noted in the left base projecting in the left lower lobe on the lateral projection. No effusion is seen. No bony abnormality is noted. IMPRESSION: Mild left lower lobe infiltrate. Electronically Signed   By: Alcide CleverMark  Lukens M.D.   On: 02/12/2019 21:08    Infiltrate left lower lobe ____________________________________________   PROCEDURES  Procedure(s) performed: None  Procedures  Critical Care performed: Yes, see critical care note(s)  CRITICAL CARE Performed by: Sharyn CreamerMark Quale   Total critical care time: 30 minutes  Critical care time was exclusive of separately billable procedures and treating other patients.  Critical care was necessary to treat or prevent imminent  or life-threatening deterioration.  Critical care was time spent personally by me on the following activities: development of treatment plan with patient and/or surrogate as well as nursing, discussions with consultants, evaluation of patient's response to treatment, examination of patient, obtaining history from patient or surrogate, ordering and performing treatments and interventions, ordering and review of laboratory studies, ordering and review of radiographic studies, pulse oximetry and re-evaluation of patient's condition.  ____________________________________________   INITIAL IMPRESSION / ASSESSMENT AND PLAN / ED COURSE  Pertinent labs & imaging results that were available during my care of the patient were reviewed by me and considered in my medical decision making (see chart for details).   Patient presents for evaluation altered mental status fever  and cough  Clinical Course as of Feb 13 23  Wed Feb 12, 2019  2054 Discussed with brother. Patient lost appetitie. Not eating much. Here 3 weeks ago and given vitamins and omeprazole. Patient is very weak. He also noticed today patient was short of breath, very fatigued. Brother denies sick contacts or COVID exposures. Patient is confused, seems to want to speak but struggles to speak for 3 days. Interpreter (rafael) utilized.   [MQ]  2311 Case reviewed, discussed with Dr. Marcell BarlowYarborough. 6mm SDH is noted. Also discussed concern of fever and pneumonia/sepsis. Dr. Marcell BarlowYarborough advises does NOT need transfer related to SDH and can repeat CT head tomorrow and neurosurg consult tomorrow AM. Watch and keep platelet count > 75k   [MQ]    Clinical Course User Index [MQ] Sharyn CreamerQuale, Mark, MD   Ryan Matthews was evaluated in Emergency Department on 02/13/2019 for the symptoms described in the history of present illness. He was evaluated in the context of the global COVID-19 pandemic, which necessitated consideration that the patient might be at risk for infection with the SARS-CoV-2 virus that causes COVID-19. Institutional protocols and algorithms that pertain to the evaluation of patients at risk for COVID-19 are in a state of rapid change based on information released by regulatory bodies including the CDC and federal and state organizations. These policies and algorithms were followed during the patient's care in the ED.  COVID test negative.  Assessment and evaluation appear consistent with pneumonia, recent inpatient stay will treat and treating for sepsis with associated healthcare associated pneumonia.  COVID negative.  ----------------------------------------- 10:42 PM on 02/12/2019 -----------------------------------------    Patient responding to treatment,  Vitals:   02/12/19 2230 02/12/19 2330  BP: 116/81   Pulse: (!) 105 82  Resp: 20 (!) 22  Temp:    SpO2: 98% 100%   Reassessment.  Heart  rate improved.  Normotensive.  Normal oxygen saturation on room air.  Will admit to hospitalist service for further work-up and care.  Appears to be improving.  Discussed with Janeann MerlAngela Seals, hospitalist for admission ____________________________________________   FINAL CLINICAL IMPRESSION(S) / ED DIAGNOSES  Final diagnoses:  HCAP (healthcare-associated pneumonia)  Sepsis, due to unspecified organism, unspecified whether acute organ dysfunction present (HCC)  Subdural hematoma (HCC)        Note:  This document was prepared using Dragon voice recognition software and may include unintentional dictation errors       Sharyn CreamerQuale, Mark, MD 02/13/19 0025

## 2019-02-12 NOTE — Progress Notes (Signed)
PHARMACY -  BRIEF ANTIBIOTIC NOTE   Pharmacy has received consult(s) for Vancomycin, Cefepime from an ED provider.  The patient's profile has been reviewed for ht/wt/allergies/indication/available labs.    One time order(s) placed for  Vancomycin 1250 mg IV X 1 and Cefepime 2 gm IV X 1 .   Further antibiotics/pharmacy consults should be ordered by admitting physician if indicated.                       Thank you, Daiana Vitiello D 02/12/2019  9:34 PM

## 2019-02-12 NOTE — Consult Note (Signed)
Conslt received.  Case discussed with Dr. Jacqualine Code  Patient presented with fever, respiratory symptoms, and decline in communication.    He was found to have signs and symptoms of sepsis.  HCT shows small parafalcine subdural hematoma.  A/P) This represents a non-operative subdural hematoma.  It does not fully explain his change in communication.  - consider MRI brain - keep platelets >75k, INR 1.3 or less - Repeat HCT in AM  Full note to follow.  Meade Maw MD

## 2019-02-12 NOTE — ED Notes (Signed)
Repeat blood draw sent to lab

## 2019-02-12 NOTE — ED Triage Notes (Signed)
Pt is spanish speaking from home. Unable to answer any questions via interpreter. Pt has a persistent hacking cough and fever. Family called 911 due to decreased PO intake x 2 weeks. Pt is soaked with urine and has a foul odor. Pt has a large BM present in underwear. No diaper on. Heart rate 128 with EMS, blood sugar 121, BP 129/83, 500 ml of fluid given. Pt is hot to the touch. 650mg  of tylenol given by EMS.

## 2019-02-12 NOTE — Progress Notes (Signed)
CODE SEPSIS - PHARMACY COMMUNICATION  **Broad Spectrum Antibiotics should be administered within 1 hour of Sepsis diagnosis**  Time Code Sepsis Called/Page Received: 2053  Antibiotics Ordered: vanc/cefepime  Time of 1st antibiotic administration: 2139  Additional action taken by pharmacy:   If necessary, Name of Provider/Nurse Contacted:     Tobie Lords ,PharmD Clinical Pharmacist  02/12/2019  9:59 PM

## 2019-02-12 NOTE — ED Notes (Signed)
Pt to ct 

## 2019-02-13 ENCOUNTER — Inpatient Hospital Stay: Payer: Medicaid Other

## 2019-02-13 DIAGNOSIS — F329 Major depressive disorder, single episode, unspecified: Secondary | ICD-10-CM | POA: Diagnosis present

## 2019-02-13 DIAGNOSIS — Z23 Encounter for immunization: Secondary | ICD-10-CM | POA: Diagnosis not present

## 2019-02-13 DIAGNOSIS — Z681 Body mass index (BMI) 19 or less, adult: Secondary | ICD-10-CM | POA: Diagnosis not present

## 2019-02-13 DIAGNOSIS — E43 Unspecified severe protein-calorie malnutrition: Secondary | ICD-10-CM | POA: Diagnosis present

## 2019-02-13 DIAGNOSIS — R740 Nonspecific elevation of levels of transaminase and lactic acid dehydrogenase [LDH]: Secondary | ICD-10-CM | POA: Diagnosis present

## 2019-02-13 DIAGNOSIS — D61818 Other pancytopenia: Secondary | ICD-10-CM | POA: Diagnosis present

## 2019-02-13 DIAGNOSIS — Z20828 Contact with and (suspected) exposure to other viral communicable diseases: Secondary | ICD-10-CM | POA: Diagnosis present

## 2019-02-13 DIAGNOSIS — Y95 Nosocomial condition: Secondary | ICD-10-CM | POA: Diagnosis present

## 2019-02-13 DIAGNOSIS — I10 Essential (primary) hypertension: Secondary | ICD-10-CM | POA: Diagnosis present

## 2019-02-13 DIAGNOSIS — F0390 Unspecified dementia without behavioral disturbance: Secondary | ICD-10-CM | POA: Diagnosis present

## 2019-02-13 DIAGNOSIS — E876 Hypokalemia: Secondary | ICD-10-CM | POA: Diagnosis not present

## 2019-02-13 DIAGNOSIS — R64 Cachexia: Secondary | ICD-10-CM | POA: Diagnosis present

## 2019-02-13 DIAGNOSIS — G9341 Metabolic encephalopathy: Secondary | ICD-10-CM | POA: Diagnosis present

## 2019-02-13 DIAGNOSIS — A419 Sepsis, unspecified organism: Secondary | ICD-10-CM | POA: Diagnosis present

## 2019-02-13 DIAGNOSIS — D638 Anemia in other chronic diseases classified elsewhere: Secondary | ICD-10-CM | POA: Diagnosis present

## 2019-02-13 DIAGNOSIS — Z79899 Other long term (current) drug therapy: Secondary | ICD-10-CM | POA: Diagnosis not present

## 2019-02-13 DIAGNOSIS — R768 Other specified abnormal immunological findings in serum: Secondary | ICD-10-CM | POA: Diagnosis present

## 2019-02-13 DIAGNOSIS — R3129 Other microscopic hematuria: Secondary | ICD-10-CM | POA: Diagnosis not present

## 2019-02-13 DIAGNOSIS — J189 Pneumonia, unspecified organism: Secondary | ICD-10-CM | POA: Diagnosis present

## 2019-02-13 DIAGNOSIS — E54 Ascorbic acid deficiency: Secondary | ICD-10-CM | POA: Diagnosis present

## 2019-02-13 DIAGNOSIS — R627 Adult failure to thrive: Secondary | ICD-10-CM | POA: Diagnosis present

## 2019-02-13 DIAGNOSIS — J9601 Acute respiratory failure with hypoxia: Secondary | ICD-10-CM | POA: Diagnosis present

## 2019-02-13 DIAGNOSIS — I6200 Nontraumatic subdural hemorrhage, unspecified: Secondary | ICD-10-CM | POA: Diagnosis present

## 2019-02-13 DIAGNOSIS — Z515 Encounter for palliative care: Secondary | ICD-10-CM | POA: Diagnosis not present

## 2019-02-13 DIAGNOSIS — Z66 Do not resuscitate: Secondary | ICD-10-CM | POA: Diagnosis not present

## 2019-02-13 LAB — CBC
HCT: 24.2 % — ABNORMAL LOW (ref 39.0–52.0)
Hemoglobin: 7.4 g/dL — ABNORMAL LOW (ref 13.0–17.0)
MCH: 26.9 pg (ref 26.0–34.0)
MCHC: 30.6 g/dL (ref 30.0–36.0)
MCV: 88 fL (ref 80.0–100.0)
Platelets: 73 10*3/uL — ABNORMAL LOW (ref 150–400)
RBC: 2.75 MIL/uL — ABNORMAL LOW (ref 4.22–5.81)
RDW: 19.5 % — ABNORMAL HIGH (ref 11.5–15.5)
WBC: 5.2 10*3/uL (ref 4.0–10.5)
nRBC: 0 % (ref 0.0–0.2)

## 2019-02-13 LAB — BASIC METABOLIC PANEL
Anion gap: 7 (ref 5–15)
BUN: 17 mg/dL (ref 8–23)
CO2: 18 mmol/L — ABNORMAL LOW (ref 22–32)
Calcium: 7.1 mg/dL — ABNORMAL LOW (ref 8.9–10.3)
Chloride: 114 mmol/L — ABNORMAL HIGH (ref 98–111)
Creatinine, Ser: 0.9 mg/dL (ref 0.61–1.24)
GFR calc Af Amer: >60 mL/min (ref >60)
GFR calc non Af Amer: >60 mL/min (ref >60)
Glucose, Bld: 87 mg/dL (ref 70–99)
Potassium: 3.9 mmol/L (ref 3.5–5.1)
Sodium: 139 mmol/L (ref 135–145)

## 2019-02-13 LAB — PROTIME-INR
INR: 1.3 — ABNORMAL HIGH (ref 0.8–1.2)
Prothrombin Time: 15.6 seconds — ABNORMAL HIGH (ref 11.4–15.2)

## 2019-02-13 LAB — TYPE AND SCREEN
ABO/RH(D): O POS
Antibody Screen: NEGATIVE

## 2019-02-13 LAB — LACTIC ACID, PLASMA: Lactic Acid, Venous: 1 mmol/L (ref 0.5–1.9)

## 2019-02-13 MED ORDER — ACETAMINOPHEN 325 MG PO TABS
650.0000 mg | ORAL_TABLET | Freq: Once | ORAL | Status: AC
  Administered 2019-02-13: 650 mg via ORAL
  Filled 2019-02-13: qty 2

## 2019-02-13 MED ORDER — VITAMIN B-12 1000 MCG PO TABS
1000.0000 ug | ORAL_TABLET | Freq: Every day | ORAL | Status: DC
  Administered 2019-02-13 – 2019-02-17 (×5): 1000 ug via ORAL
  Filled 2019-02-13 (×5): qty 1

## 2019-02-13 MED ORDER — SODIUM CHLORIDE 0.9% IV SOLUTION
Freq: Once | INTRAVENOUS | Status: AC
  Administered 2019-02-13: 12:00:00 via INTRAVENOUS

## 2019-02-13 MED ORDER — SODIUM CHLORIDE 0.9 % IV SOLN
2.0000 g | INTRAVENOUS | Status: AC
  Administered 2019-02-13 – 2019-02-17 (×5): 2 g via INTRAVENOUS
  Filled 2019-02-13 (×5): qty 2

## 2019-02-13 MED ORDER — PANTOPRAZOLE SODIUM 40 MG PO TBEC
40.0000 mg | DELAYED_RELEASE_TABLET | Freq: Every day | ORAL | Status: DC
  Administered 2019-02-13 – 2019-02-19 (×7): 40 mg via ORAL
  Filled 2019-02-13 (×7): qty 1

## 2019-02-13 MED ORDER — SODIUM CHLORIDE 0.9 % IV SOLN
INTRAVENOUS | Status: DC
  Administered 2019-02-13 – 2019-02-19 (×11): via INTRAVENOUS

## 2019-02-13 MED ORDER — ENSURE ENLIVE PO LIQD
1.0000 | Freq: Three times a day (TID) | ORAL | Status: DC
  Administered 2019-02-13 – 2019-02-19 (×11): 237 mL via ORAL

## 2019-02-13 MED ORDER — ENOXAPARIN SODIUM 40 MG/0.4ML ~~LOC~~ SOLN: 40.0000 mg | SUBCUTANEOUS | Status: DC

## 2019-02-13 MED ORDER — GUAIFENESIN 100 MG/5ML PO SOLN
5.0000 mL | ORAL | Status: DC | PRN
  Administered 2019-02-13 – 2019-02-16 (×2): 100 mg via ORAL
  Filled 2019-02-13 (×3): qty 10

## 2019-02-13 MED ORDER — VITAMIN D (ERGOCALCIFEROL) 1.25 MG (50000 UNIT) PO CAPS
50000.0000 [IU] | ORAL_CAPSULE | ORAL | Status: DC
  Administered 2019-02-13: 11:00:00 50000 [IU] via ORAL
  Filled 2019-02-13: qty 1

## 2019-02-13 MED ORDER — SODIUM CHLORIDE 0.9 % IV SOLN
500.0000 mg | INTRAVENOUS | Status: AC
  Administered 2019-02-13 – 2019-02-17 (×5): 500 mg via INTRAVENOUS
  Filled 2019-02-13 (×5): qty 500

## 2019-02-13 NOTE — Consult Note (Signed)
Referring Physician:  No referring provider defined for this encounter.  Primary Physician:  Patient, No Pcp Per  Chief Complaint:  Intracranial hemorrhage  The patient is unable to give history - Level 5 consultation due to inability to altered mental status in patient.   History of Present Illness: 02/13/2019 Ryan Matthews is a 68 y.o. male who presents with the chief complaint of intracranial hemorrhage.  He has known history of dementia, and was noted to have increasing confusion, cough, and fever.  He has had failure to thrive outside of the hospital.  He became less communicative per his brother, and was brought to the hospital where he was noted to be septic.  He was scanned due to poor verbal communication, and a small L parafalcine subdural hematoma was found.  Ryan Matthews is unable to give history today. He denies headache.     Review of Systems:  A 10 point review of systems is negative, except for the pertinent positives and negatives detailed in the HPI.  Past Medical History: Past Medical History:  Diagnosis Date  . Dementia ([place])   . Hypertension   . Other pancytopenia ([place]) 01/09/2019    Past Surgical History: History reviewed. No pertinent surgical history.  Allergies: Allergies as of 02/12/2019  . (No Known Allergies)    Medications:  Current Facility-Administered Medications:  .  0.9 %  sodium chloride infusion (Manually program via Guardrails IV Fluids), , Intravenous, Once, Demetrios Loll, MD .  0.9 %  sodium chloride infusion, , Intravenous, Continuous, Seals, Theo Dills, NP, Last Rate: 125 mL/hr at 02/13/19 0418 .  acetaminophen (TYLENOL) tablet 650 mg, 650 mg, Oral, Once, Demetrios Loll, MD .  azithromycin [facility]) 500 mg in sodium chloride 0.9 % 250 mL IVPB, 500 mg, Intravenous, Q24H, Seals, Angela H, NP, Last Rate: 250 mL/hr at 02/13/19 0550, 500 mg at 02/13/19 0550 .  cefTRIAXone (ROCEPHIN) 2 g in sodium chloride 0.9 % 100 mL IVPB, 2 g,  Intravenous, Q24H, Seals, Angela H, NP, Last Rate: 200 mL/hr at 02/13/19 0522, 2 g at 02/13/19 0522 .  feeding supplement (ENSURE ENLIVE) (ENSURE ENLIVE) liquid 237 mL, 1 Bottle, Oral, TID BM, Seals, Angela H, NP .  pantoprazole (PROTONIX) EC tablet 40 mg, 40 mg, Oral, Daily, Seals, Angela H, NP .  vitamin B-12 (CYANOCOBALAMIN) tablet 1,000 mcg, 1,000 mcg, Oral, Daily, Seals, Theo Dills, NP .  Vitamin D (Ergocalciferol) (DRISDOL) capsule 50,000 Units, 50,000 Units, Oral, Q7 days, Seals, Theo Dills, NP   Social History: Social History   Tobacco Use  . Smoking status: Not on file  Substance Use Topics  . Alcohol use: Not Currently  . Drug use: Not Currently    Family Medical History: No family history on file.  Physical Examination: Vitals:   02/13/19 0424 02/13/19 0540  BP: 103/70 106/70  Pulse: 79 99  Resp: 16 18  Temp:    SpO2: 98% 100%     General: Patient is cachectic and in no apparent distress. He speaks when spoken to in [place] and some [place].  Psychiatric: Patient is non-anxious.  Head:  Pupils equal, round, and reactive to light.  ENT:  Oral mucosa appears well hydrated.  Neck:   Supple.  Full range of motion.  Respiratory: Patient is breathing without any difficulty.  Extremities: No edema. Very thin.  Vascular: Palpable pulses in dorsal pedal vessels.  Skin:   On exposed skin, there are no abnormal skin lesions.  NEUROLOGICAL:  General: In no acute distress.  Awake, alert, oriented to person and year. He is unable to name the hospital.    Pupils equal round and reactive to light.  Facial tone is symmetric.  Tongue protrusion is midline.  There is no pronator drift.  ROM of spine: full.  Palpation of spine: nontender.    Strength:  He does not cooperate with full motor examination, but moves all limbs grossly symmetrically and at least 4/5.   Sensory examination is unreliable.   Reflexes are 1+ and symmetric at the biceps, triceps,  brachioradialis, patella and achilles.   Clonus is not present.  Toes are down-going.  Gait is untested.   Imaging: CT Head 02/12/2019 IMPRESSION: 6 mm parafalcine subdural hemorrhage along the posterior falx and the left tentorium. No mass effect or midline shift.  These results were called by telephone at the time of interpretation on 02/12/2019 at 10:58 pm to Dr. Sharyn CreamerMARK QUALE , who verbally acknowledged these results.   Electronically Signed   By: Charlett NoseKevin  Dover M.D.   On: 02/12/2019 23:00  I have personally reviewed the images and agree with the above interpretation.  Assessment and Plan: Ryan Matthews is a pleasant 68 y.o. male with sepsis, altered mental status, dementia, and new finding of acute parafalcine subdural hematoma.  I would not recommend surgical intervention at this time.  I recommend repeat head CT today (ordered).  Please monitor platelet count with goal >75k.  Would not transfuse today, but would consider transfusion if worsens. INR goal 1.3 or less.  Do not recommend anti-seizure meds given altered mental status and risk for agitation.  He is very cachectic.  I am concerned that there is some other process ongoing.  I defer to the primary team for further workup of his failure to thrive.  We will continue to follow.   Breelynn Bankert K. Myer HaffYarbrough MD, MPHS Dept. of Neurosurgery   \

## 2019-02-13 NOTE — Progress Notes (Signed)
Advanced Care Plan.  Purpose of Encounter: CODE STATUS. Parties in Attendance: The patient, his brother and me. Patient's Decisional Capacity: No. Medical Story: Ryan Matthews  is a 68 y.o. male with a known history of dementia, hypertension, and pancytopenia.  Patient is being admitted for subdural hematoma, sepsis due to pneumonia.  I discussed with the patient's brother about his current condition, prognosis and CODE STATUS.  The patient brothers wantS him to be resuscitated and intubated if he has cardiopulmonary arrest. Goals of Care Determinations:  Plan:  Code Status: Full code. Time spent discussing advance care planning: 18 minutes.

## 2019-02-13 NOTE — H&P (Signed)
[facility] [place] [facility]   PATIENT NAME: Ryan SayresSergio Matthews    MR#:  [id number]  DATE OF BIRTH:  08/30/1950  DATE OF ADMISSION:  02/12/2019  PRIMARY CARE PHYSICIAN: Patient, No Pcp Per   REQUESTING/REFERRING PHYSICIAN: Sharyn CreamerMark Quale, MD  CHIEF COMPLAINT:   Chief Complaint  Patient presents with  . Code Sepsis    HISTORY OF PRESENT ILLNESS:  Ryan SayresSergio Abbs  is a 68 y.o. male with a known history of dementia, hypertension, and pancytopenia.  Patient lives with his brother who is his primary caregiver.  He was brought to the emergency room for evaluation of confusion with altered mental status as well as a persistent hacking cough and fever.  Patient was noted by his brother to have decreased p.o. intake over the last 2 weeks with increasing confusion and weakness.  EMS reported patient being found soaked with urine and a large bowel movement.  Patient was given 650 mg Tylenol prior to arrival with subjective fever according to EMS documentation.  Patient is oriented to person and time while I am seeing him.  He is disoriented to place or situation.  Patient was tachycardic on arrival to the emergency room with heart rate 126.  Respirations were 19 with oxygen saturation 96% on room air.  Blood pressure was 128/96.  Lactic acid was 1.0.  WBC 6.1 with platelet count 79.  Hemoglobin 8.1 with hematocrit 25.5.  INR is 1.3.  Rapid COVID testing is negative.  Urinalysis is negative.  CT brain demonstrates small parafalcine subdural hematoma.  Dr. Myer HaffYarbrough was consulted by the ED physician with orders received to keep platelet count above 75,000 and INR less than 1.3.  Chest x-ray demonstrated left lower lobe infiltrate.  He has been admitted to the hospitalist service for sepsis with left lower lobe pneumonia.  PAST MEDICAL HISTORY:   Past Medical History:  Diagnosis Date  . Dementia (HCC)   . Hypertension   . Other pancytopenia (HCC) 01/09/2019    PAST SURGICAL  HISTORY:  History reviewed. No pertinent surgical history.  SOCIAL HISTORY:   Social History   Tobacco Use  . Smoking status: Not on file  Substance Use Topics  . Alcohol use: Not Currently    FAMILY HISTORY:  No family history on file.  DRUG ALLERGIES:  No Known Allergies  REVIEW OF SYSTEMS:   Review of Systems  Constitutional: Positive for chills, fever and malaise/fatigue. Negative for diaphoresis and weight loss.  HENT: Positive for congestion. Negative for sinus pain and sore throat.   Eyes: Negative for blurred vision and double vision.  Respiratory: Positive for cough, sputum production, shortness of breath and wheezing. Negative for hemoptysis.   Cardiovascular: Negative for chest pain and palpitations.  Gastrointestinal: Negative for abdominal pain, constipation, diarrhea, heartburn, nausea and vomiting.  Genitourinary: Negative for dysuria, flank pain, frequency, hematuria and urgency.  Musculoskeletal: Positive for falls. Negative for myalgias and neck pain.  Skin: Negative.  Negative for itching and rash.  Neurological: Positive for weakness. Negative for dizziness, focal weakness, seizures and headaches.  Psychiatric/Behavioral: Negative.  Negative for depression.      MEDICATIONS AT HOME:   Prior to Admission medications   Medication Sig Start Date End Date Taking? Authorizing Provider  feeding supplement, ENSURE ENLIVE, (ENSURE ENLIVE) LIQD Take 237 mLs by mouth 3 (three) times daily between meals. 01/09/19   Alford HighlandWieting, Richard, MD  omeprazole (PRILOSEC) 20 MG capsule Take 1 capsule (20 mg total) by mouth daily. 01/09/19  01/09/20  Loletha Grayer, MD  vitamin B-12 (CYANOCOBALAMIN) 1000 MCG tablet Take 1 tablet (1,000 mcg total) by mouth daily. 01/09/19   Loletha Grayer, MD  Vitamin D, Ergocalciferol, (DRISDOL) 1.25 MG (50000 UT) CAPS capsule Take 1 capsule (50,000 Units total) by mouth every 7 (seven) days. 01/09/19   Loletha Grayer, MD      VITAL SIGNS:   Blood pressure 103/70, pulse 79, temperature 99.8 F (37.7 C), temperature source Rectal, resp. rate 16, weight 53.5 kg, SpO2 98 %.  PHYSICAL EXAMINATION:  Physical Exam  GENERAL:  68 y.o.-year-old thin emaciated appearing patient lying in the bed with no acute distress.  EYES: Pupils equal, round, reactive to light and accommodation. No scleral icterus. Extraocular muscles intact.  HEENT: Head atraumatic, normocephalic. Oropharynx and nasopharynx clear.  NECK:  Supple, no jugular venous distention. No thyroid enlargement, no tenderness.  LUNGS: Normal breath sounds bilaterally,Bilateral wheezes and rales,No rhonchi or crepitation. No use of accessory muscles of respiration.  CARDIOVASCULAR: Regular rate and rhythm, S1, S2 normal. No murmurs, rubs, or gallops.  ABDOMEN: Soft, nondistended, nontender. Bowel sounds present. No organomegaly or mass.  EXTREMITIES: No pedal edema, cyanosis, or clubbing.  NEUROLOGIC: Cranial nerves II through XII are intact. Muscle strength 5/5 in all extremities. Sensation intact. Gait not checked.  PSYCHIATRIC: The patient is alert and oriented x 2 to person and time.  Normal affect and good eye contact. SKIN: No obvious rash, lesion, or ulcer.   LABORATORY PANEL:   CBC Recent Labs  Lab 02/12/19 2057  WBC 6.1  HGB 8.1*  HCT 25.5*  PLT 79*   ------------------------------------------------------------------------------------------------------------------  Chemistries  Recent Labs  Lab 02/12/19 2057  NA 136  K 3.8  CL 111  CO2 19*  GLUCOSE 98  BUN 15  CREATININE 0.90  CALCIUM 7.1*  AST 84*  ALT 39  ALKPHOS 246*  BILITOT 0.7   ------------------------------------------------------------------------------------------------------------------  Cardiac Enzymes No results for input(s): TROPONINI in the last 168 hours. ------------------------------------------------------------------------------------------------------------------   RADIOLOGY:  Ct Head Wo Contrast  Result Date: 02/12/2019 CLINICAL DATA:  Altered mental status EXAM: CT HEAD WITHOUT CONTRAST TECHNIQUE: Contiguous axial images were obtained from the base of the skull through the vertex without intravenous contrast. COMPARISON:  01/06/2019 FINDINGS: Brain: There is a midline subdural hematoma along the posterior aspect of the falx, new since prior study. This likely also along the left side of the tentorium. Maximum thickness along the falx is 6 mm. No mass effect or midline shift. No acute infarct or hydrocephalus. Vascular: No hyperdense vessel or unexpected calcification. Skull: No acute calvarial abnormality. Sinuses/Orbits: Visualized paranasal sinuses and mastoids clear. Orbital soft tissues unremarkable. Other: None IMPRESSION: 6 mm parafalcine subdural hemorrhage along the posterior falx and the left tentorium. No mass effect or midline shift. These results were called by telephone at the time of interpretation on 02/12/2019 at 10:58 pm to Dr. Delman Kitten , who verbally acknowledged these results. Electronically Signed   By: Rolm Baptise M.D.   On: 02/12/2019 23:00   Dg Chest Portable 1 View  Result Date: 02/12/2019 CLINICAL DATA:  Fever and cough EXAM: PORTABLE CHEST 1 VIEW COMPARISON:  01/07/2019 FINDINGS: Cardiac shadows within normal limits. The lungs are well aerated bilaterally. Patchy infiltrate is noted in the left base projecting in the left lower lobe on the lateral projection. No effusion is seen. No bony abnormality is noted. IMPRESSION: Mild left lower lobe infiltrate. Electronically Signed   By: Inez Catalina M.D.   On: 02/12/2019 21:08  IMPRESSION AND PLAN:   1.  Sepsis - Blood cultures pending -We will repeat lactic acid per protocol -Patient received 1 L fluid bolus with normal saline currently infusing to peripheral IV at 125 cc/h. --He has been started on IV antibiotic therapy - We will repeat CBC and BMP in the a.m.  2.  Left lower  lobe pneumonia- possibly H CAP with recent hospitalization 1 month ago for altered mental status - Treatment initiated with azithromycin and Rocephin -Sputum culture pending --DuoNeb every 6 hours as needed for shortness of breath or cough  3.  Subdural hematoma - Dr. Myer HaffYarbrough with neurosurgery consulted by the ED physician with orders to keep platelet count above 75,000 with current platelet count at 79,000 and INR less than 1.3. -We will repeat CBC and INR in the a.m. - Continue neurochecks every 4 hours  4.  Altered mental status - Also hospitalized 1 month ago with altered mental status --May be secondary to sepsis.  We will continue to monitor for improvement with IV antibiotic therapy - We will continue neurologic checks as stated above  DVT prophylaxis with SCDs and PPI prophylaxis.    All the records are reviewed and case discussed with ED provider. The plan of care was discussed in details with the patient (and family). I answered all questions. The patient agreed to proceed with the above mentioned plan. Further management will depend upon hospital course.   CODE STATUS: Full code  TOTAL TIME TAKING CARE OF THIS PATIENT: 45 minutes.    Milas Kocherngela H Merrily Tegeler CRNP on 02/13/2019 at 4:40 AM  Pager - [phone]  After 6pm go to [email or web address] - Social research officer, governmentpassword [email or web address] [email or web address]  [facility] [place] [facility]  Office  [phone]  CC: Primary care physician; Patient, No Pcp Per   Note: This dictation was prepared with Dragon dictation along with smaller phrase technology. Any transcriptional errors that result from this process are unintentional.

## 2019-02-13 NOTE — ED Notes (Signed)
ED TO INPATIENT HANDOFF REPORT  ED Nurse Name and Phone #: jen  S Name/Age/Gender Ryan SayresSergio Matthews 68 y.o. male Room/Bed: ED16A/ED16A  Code Status   Code Status: Full Code  Home/SNF/Other Home Patient oriented to: self Is this baseline? Yes   Triage Complete: Triage complete  Chief Complaint Sepsis  Triage Note Pt is spanish speaking from home. Unable to answer any questions via interpreter. Pt has a persistent hacking cough and fever. Family called 911 due to decreased PO intake x 2 weeks. Pt is soaked with urine and has a foul odor. Pt has a large BM present in underwear. No diaper on. Heart rate 128 with EMS, blood sugar 121, BP 129/83, 500 ml of fluid given. Pt is hot to the touch. 650mg  of tylenol given by EMS.   Allergies No Known Allergies  Level of Care/Admitting Diagnosis ED Disposition    ED Disposition Condition Comment   Admit  [facility]ospital [facility] [[id number]]  Level of Care: Med-Surg [16]  Covid Evaluation: Confirmed COVID Negative  Diagnosis: LLL pneumonia [facility](HCC) [[id number]][[id number]]  Admitting Physician: Pearletha AlfredSEALS, ANGELA H [[id number]]  Attending Physician: Pearletha AlfredSEALS, ANGELA H [[id number]]  Estimated length of stay: past midnight tomorrow  Certification:: I certify this patient will need inpatient services for at least 2 midnights  PT Class (Do Not Modify): Inpatient [101]  PT Acc Code (Do Not Modify): Private [1]       B Medical/Surgery History Past Medical History:  Diagnosis Date  . Dementia (HCC)   . Hypertension   . Other pancytopenia (HCC) 01/09/2019   History reviewed. No pertinent surgical history.   A IV Location/Drains/Wounds Patient Lines/Drains/Airways Status   Active Line/Drains/Airways    Name:   Placement date:   Placement time:   Site:   Days:   Peripheral IV 02/12/19 Left Forearm   02/12/19    2100    Forearm   1   Peripheral IV 02/12/19 Right Antecubital   02/12/19    2106    Antecubital   1          Intake/Output Last  24 hours No intake or output data in the 24 hours ending 02/13/19 0310  Labs/Imaging Results for orders placed or performed during the hospital encounter of 02/12/19 (from the past 48 hour(s))  Lactic acid, plasma     Status: Abnormal   Collection Time: 02/12/19  8:52 PM  Result Value Ref Range   Lactic Acid, Venous 2.0 (HH) 0.5 - 1.9 mmol/L    Comment: CRITICAL RESULT CALLED TO, READ BACK BY AND VERIFIED WITH SHERRIE ALLISON @2155  02/12/19 MJU Performed at [facility], [address] [place], [place] [address]   Comprehensive metabolic panel     Status: Abnormal   Collection Time: 02/12/19  8:57 PM  Result Value Ref Range   Sodium 136 135 - 145 mmol/L   Potassium 3.8 3.5 - 5.1 mmol/L   Chloride 111 98 - 111 mmol/L   CO2 19 (L) 22 - 32 mmol/L   Glucose, Bld 98 70 - 99 mg/dL   BUN 15 8 - 23 mg/dL   Creatinine, Ser 4.400.90 0.61 - 1.24 mg/dL   Calcium 7.1 (L) 8.9 - 10.3 mg/dL   Total Protein 6.8 6.5 - 8.1 g/dL   Albumin 1.6 (L) 3.5 - 5.0 g/dL   AST 84 (H) 15 - 41 U/L   ALT 39 0 - 44 U/L   Alkaline Phosphatase 246 (H) 38 - 126 U/L   Total Bilirubin  0.7 0.3 - 1.2 mg/dL   GFR calc non Af Amer >60 >60 mL/min   GFR calc Af Amer >60 >60 mL/min   Anion gap 6 5 - 15    Comment: Performed at [facility], [address] [place], [place] [address]  Lipase, blood     Status: None   Collection Time: 02/12/19  8:57 PM  Result Value Ref Range   Lipase 28 11 - 51 U/L    Comment: Performed at [facility], [address] [place], [place] [address]  CBC WITH DIFFERENTIAL     Status: Abnormal   Collection Time: 02/12/19  8:57 PM  Result Value Ref Range   WBC 6.1 4.0 - 10.5 K/uL   RBC 2.95 (L) 4.22 - 5.81 MIL/uL   Hemoglobin 8.1 (L) 13.0 - 17.0 g/dL   HCT 25.5 (L) 39.0 - 52.0 %   MCV 86.4 80.0 - 100.0 fL   MCH 27.5 26.0 - 34.0 pg   MCHC 31.8 30.0 - 36.0 g/dL   RDW 19.5 (H) 11.5 - 15.5 %   Platelets 79 (L) 150 - 400 K/uL    Comment: Immature Platelet Fraction  may be clinically indicated, consider ordering this additional test [id number]    nRBC 0.0 0.0 - 0.2 %   Neutrophils Relative % 82 %   Neutro Abs 5.1 1.7 - 7.7 K/uL   Lymphocytes Relative 9 %   Lymphs Abs 0.5 (L) 0.7 - 4.0 K/uL   Monocytes Relative 8 %   Monocytes Absolute 0.5 0.1 - 1.0 K/uL   Eosinophils Relative 0 %   Eosinophils Absolute 0.0 0.0 - 0.5 K/uL   Basophils Relative 0 %   Basophils Absolute 0.0 0.0 - 0.1 K/uL   WBC Morphology INCREASED BANDS (>20% BANDS)    Smear Review PLATELETS APPEAR DECREASED    Immature Granulocytes 1 %   Abs Immature Granulocytes 0.03 0.00 - 0.07 K/uL   Target Cells PRESENT     Comment: Performed at [facility], [address] [place], [place] [address]  Protime-INR     Status: Abnormal   Collection Time: 02/12/19  8:57 PM  Result Value Ref Range   Prothrombin Time 15.7 (H) 11.4 - 15.2 seconds   INR 1.3 (H) 0.8 - 1.2    Comment: (NOTE) INR goal varies based on device and disease states. Performed at [facility], [address] [place], [place] [address]   Urinalysis, Routine w reflex microscopic     Status: Abnormal   Collection Time: 02/12/19  8:57 PM  Result Value Ref Range   Color, Urine AMBER (A) YELLOW    Comment: BIOCHEMICALS MAY BE AFFECTED BY COLOR   APPearance HAZY (A) CLEAR   Specific Gravity, Urine 1.020 1.005 - 1.030   pH 5.0 5.0 - 8.0   Glucose, UA NEGATIVE NEGATIVE mg/dL   Hgb urine dipstick LARGE (A) NEGATIVE   Bilirubin Urine NEGATIVE NEGATIVE   Ketones, ur NEGATIVE NEGATIVE mg/dL   Protein, ur 100 (A) NEGATIVE mg/dL   Nitrite NEGATIVE NEGATIVE   Leukocytes,Ua NEGATIVE NEGATIVE   RBC / HPF 21-50 0 - 5 RBC/hpf   WBC, UA 0-5 0 - 5 WBC/hpf   Bacteria, UA NONE SEEN NONE SEEN   Squamous Epithelial / LPF 0-5 0 - 5   Mucus PRESENT    Granular Casts, UA PRESENT     Comment: Performed at [facility], [address] [place], [place] [address]  Procalcitonin  Status: None   Collection  Time: 02/12/19  8:57 PM  Result Value Ref Range   Procalcitonin 0.61 ng/mL    Comment:        Interpretation: PCT > 0.5 ng/mL and <= 2 ng/mL: Systemic infection (sepsis) is possible, but other conditions are known to elevate PCT as well. (NOTE)       Sepsis PCT Algorithm           Lower Respiratory Tract                                      Infection PCT Algorithm    ----------------------------     ----------------------------         PCT < 0.25 ng/mL                PCT < 0.10 ng/mL         Strongly encourage             Strongly discourage   discontinuation of antibiotics    initiation of antibiotics    ----------------------------     -----------------------------       PCT 0.25 - 0.50 ng/mL            PCT 0.10 - 0.25 ng/mL               OR       >80% decrease in PCT            Discourage initiation of                                            antibiotics      Encourage discontinuation           of antibiotics    ----------------------------     -----------------------------         PCT >= 0.50 ng/mL              PCT 0.26 - 0.50 ng/mL                AND       <80% decrease in PCT             Encourage initiation of                                             antibiotics       Encourage continuation           of antibiotics    ----------------------------     -----------------------------        PCT >= 0.50 ng/mL                  PCT > 0.50 ng/mL               AND         increase in PCT                  Strongly encourage  initiation of antibiotics    Strongly encourage escalation           of antibiotics                                     -----------------------------                                           PCT <= 0.25 ng/mL                                                 OR                                        > 80% decrease in PCT                                     Discontinue / Do not initiate                                              antibiotics Performed at [facility], [address] [place], [place] [address]   SARS Coronavirus 2 (CEPHEID- Performed in [facility] hospital lab), Hosp Order     Status: None   Collection Time: 02/12/19  8:57 PM   Specimen: Urine, Catheterized; Nasopharyngeal  Result Value Ref Range   SARS Coronavirus 2 NEGATIVE NEGATIVE    Comment: (NOTE) If result is NEGATIVE SARS-CoV-2 target nucleic acids are NOT DETECTED. The SARS-CoV-2 RNA is generally detectable in upper and lower  respiratory specimens during the acute phase of infection. The lowest  concentration of SARS-CoV-2 viral copies this assay can detect is 250  copies / mL. A negative result does not preclude SARS-CoV-2 infection  and should not be used as the sole basis for treatment or other  patient management decisions.  A negative result may occur with  improper specimen collection / handling, submission of specimen other  than nasopharyngeal swab, presence of viral mutation(s) within the  areas targeted by this assay, and inadequate number of viral copies  (<250 copies / mL). A negative result must be combined with clinical  observations, patient history, and epidemiological information. If result is POSITIVE SARS-CoV-2 target nucleic acids are DETECTED. The SARS-CoV-2 RNA is generally detectable in upper and lower  respiratory specimens dur ing the acute phase of infection.  Positive  results are indicative of active infection with SARS-CoV-2.  Clinical  correlation with patient history and other diagnostic information is  necessary to determine patient infection status.  Positive results do  not rule out bacterial infection or co-infection with other viruses. If result is PRESUMPTIVE POSTIVE SARS-CoV-2 nucleic acids MAY BE PRESENT.   A presumptive positive result was obtained on the submitted specimen  and confirmed on repeat testing.  While 2019 novel coronavirus  (SARS-CoV-2) nucleic acids may  be present in the submitted  sample  additional confirmatory testing may be necessary for epidemiological  and / or clinical management purposes  to differentiate between  SARS-CoV-2 and other Sarbecovirus currently known to infect humans.  If clinically indicated additional testing with an alternate test  methodology [phone]) is advised. The SARS-CoV-2 RNA is generally  detectable in upper and lower respiratory sp ecimens during the acute  phase of infection. The expected result is Negative. Fact Sheet for Patients:  BoilerBrush.com.cy Fact Sheet for Healthcare Providers: https://pope.com/ This test is not yet approved or cleared by the [place] FDA and has been authorized for detection and/or diagnosis of SARS-CoV-2 by FDA under an Emergency Use Authorization (EUA).  This EUA will remain in effect (meaning this test can be used) for the duration of the COVID-19 declaration under Section 564(b)(1) of the Act, 21 U.S.C. section 360bbb-3(b)(1), unless the authorization is terminated or revoked sooner. Performed at [facility], [address] [place], [place] [address]   Ammonia     Status: None   Collection Time: 02/12/19  9:47 PM  Result Value Ref Range   Ammonia 14 9 - 35 umol/L    Comment: Performed at [facility], [address] [place], [place] [address]  Lactic acid, plasma     Status: None   Collection Time: 02/12/19 11:55 PM  Result Value Ref Range   Lactic Acid, Venous 1.0 0.5 - 1.9 mmol/L    Comment: Performed at [facility], [address] [place], [place] [address]   Ct Head Wo Contrast  Result Date: 02/12/2019 CLINICAL DATA:  Altered mental status EXAM: CT HEAD WITHOUT CONTRAST TECHNIQUE: Contiguous axial images were obtained from the base of the skull through the vertex without intravenous contrast. COMPARISON:  01/06/2019 FINDINGS: Brain: There is a midline subdural hematoma along  the posterior aspect of the falx, new since prior study. This likely also along the left side of the tentorium. Maximum thickness along the falx is 6 mm. No mass effect or midline shift. No acute infarct or hydrocephalus. Vascular: No hyperdense vessel or unexpected calcification. Skull: No acute calvarial abnormality. Sinuses/Orbits: Visualized paranasal sinuses and mastoids clear. Orbital soft tissues unremarkable. Other: None IMPRESSION: 6 mm parafalcine subdural hemorrhage along the posterior falx and the left tentorium. No mass effect or midline shift. These results were called by telephone at the time of interpretation on 02/12/2019 at 10:58 pm to Dr. Sharyn Creamer , who verbally acknowledged these results. Electronically Signed   By: Charlett Nose M.D.   On: 02/12/2019 23:00   Dg Chest Portable 1 View  Result Date: 02/12/2019 CLINICAL DATA:  Fever and cough EXAM: PORTABLE CHEST 1 VIEW COMPARISON:  01/07/2019 FINDINGS: Cardiac shadows within normal limits. The lungs are well aerated bilaterally. Patchy infiltrate is noted in the left base projecting in the left lower lobe on the lateral projection. No effusion is seen. No bony abnormality is noted. IMPRESSION: Mild left lower lobe infiltrate. Electronically Signed   By: Alcide Clever M.D.   On: 02/12/2019 21:08    Pending Labs Unresulted Labs (From admission, onward)    Start     Ordered   02/20/19 0500  Creatinine, serum  (enoxaparin (LOVENOX)    CrCl >/= 30 ml/min)  Weekly,   STAT    Comments: while on enoxaparin therapy    02/13/19 0207   02/13/19 0500  Basic metabolic panel  Tomorrow morning,   STAT     02/13/19 0207   02/13/19 0500  CBC  Tomorrow morning,   STAT     02/13/19 0207   02/13/19 0208  HIV antibody (Routine Screening)  Once,   STAT     02/13/19 0207   02/13/19 0208  CBC  (enoxaparin (LOVENOX)    CrCl >/= 30 ml/min)  Once,   STAT    Comments: Baseline for enoxaparin therapy IF NOT ALREADY DRAWN.  Notify MD if PLT < 100 K.     02/13/19 0207   02/13/19 0208  Creatinine, serum  (enoxaparin (LOVENOX)    CrCl >/= 30 ml/min)  Once,   STAT    Comments: Baseline for enoxaparin therapy IF NOT ALREADY DRAWN.    02/13/19 0207   02/13/19 0208  Sputum culture  (Non-severe pneumonia (non-ICU care) in adult without resistant organism risk factors.)  Once,   STAT    Question:  Patient immune status  Answer:  Normal   02/13/19 0207   02/12/19 2052  Blood Culture (routine x 2)  BLOOD CULTURE X 2,   STAT     02/12/19 2051          Vitals/Pain Today's Vitals   02/13/19 0045 02/13/19 0130 02/13/19 0200 02/13/19 0230  BP: 113/80 114/81 114/75 111/78  Pulse: 90 88 77 80  Resp: Temp:   99.8 F (37.7 C)   TempSrc:   Rectal   SpO2: 97% 100% 100% 100%  Weight:        Isolation Precautions Airborne and Contact precautions  Medications Medications  feeding supplement (ENSURE ENLIVE) (ENSURE ENLIVE) liquid 237 mL (has no administration in time range)  pantoprazole (PROTONIX) EC tablet 40 mg (has no administration in time range)  vitamin B-12 (CYANOCOBALAMIN) tablet 1,000 mcg (has no administration in time range)  Vitamin D (Ergocalciferol) (DRISDOL) capsule 50,000 Units (has no administration in time range)  enoxaparin (LOVENOX) injection 40 mg (has no administration in time range)  0.9 %  sodium chloride infusion (has no administration in time range)  cefTRIAXone (ROCEPHIN) 2 g in sodium chloride 0.9 % 100 mL IVPB (has no administration in time range)  azithromycin (ZITHROMAX) 500 mg in sodium chloride 0.9 % 250 mL IVPB (has no administration in time range)  ceFEPIme (MAXIPIME) 2 g in sodium chloride 0.9 % 100 mL IVPB (0 g Intravenous Stopped 02/12/19 2210)  vancomycin (VANCOCIN) 1,250 mg in sodium chloride 0.9 % 250 mL IVPB (0 mg Intravenous Stopped 02/13/19 0020)  sodium chloride 0.9 % bolus 1,000 mL (0 mLs Intravenous Stopped 02/12/19 2200)    Mobility non-ambulatory High fall risk   Focused  Assessments Pulmonary Assessment Handoff:  Lung sounds:   O2 Device: Room Air        R Recommendations: See Admitting Provider Note  Report given to:   Additional Notes:  Spanish speaking

## 2019-02-13 NOTE — Sepsis Progress Note (Signed)
Notified bedside nurse of need to draw repeat lactic acid. 

## 2019-02-14 ENCOUNTER — Inpatient Hospital Stay: Payer: Medicaid Other

## 2019-02-14 LAB — PREPARE PLATELET PHERESIS: Unit division: 0

## 2019-02-14 LAB — CBC
HCT: 27.1 % — ABNORMAL LOW (ref 39.0–52.0)
Hemoglobin: 8.3 g/dL — ABNORMAL LOW (ref 13.0–17.0)
MCH: 27.7 pg (ref 26.0–34.0)
MCHC: 30.6 g/dL (ref 30.0–36.0)
MCV: 90.3 fL (ref 80.0–100.0)
Platelets: 95 10*3/uL — ABNORMAL LOW (ref 150–400)
RBC: 3 MIL/uL — ABNORMAL LOW (ref 4.22–5.81)
RDW: 19.8 % — ABNORMAL HIGH (ref 11.5–15.5)
WBC: 7.4 10*3/uL (ref 4.0–10.5)
nRBC: 0 % (ref 0.0–0.2)

## 2019-02-14 LAB — BPAM PLATELET PHERESIS
Blood Product Expiration Date: 202007042359
ISSUE DATE / TIME: 202007021241
Unit Type and Rh: 5100

## 2019-02-14 MED ORDER — ADULT MULTIVITAMIN W/MINERALS CH
1.0000 | ORAL_TABLET | Freq: Every day | ORAL | Status: DC
  Administered 2019-02-14 – 2019-02-18 (×4): 1 via ORAL
  Filled 2019-02-14 (×5): qty 1

## 2019-02-14 MED ORDER — ONDANSETRON HCL 4 MG/2ML IJ SOLN: 4.0000 mg | Freq: Four times a day (QID) | INTRAMUSCULAR | Status: DC | PRN

## 2019-02-14 MED ORDER — BISACODYL 5 MG PO TBEC
5.0000 mg | DELAYED_RELEASE_TABLET | Freq: Every day | ORAL | Status: DC | PRN
  Filled 2019-02-14: qty 1

## 2019-02-14 MED ORDER — SENNA 8.6 MG PO TABS
1.0000 | ORAL_TABLET | Freq: Every day | ORAL | Status: DC | PRN
  Administered 2019-02-17: 8.6 mg via ORAL
  Filled 2019-02-14: qty 1

## 2019-02-14 NOTE — TOC Initial Note (Signed)
Transition of Care [facility]) - Initial/Assessment Note    Patient Details  Name: Sergi Gellner MRN: [id number] Date of Birth: March 05, 1951  Transition of Care [facility]) CM/SW Contact:    Shelbie Hutching, RN Phone Number: 02/14/2019, 12:59 PM  Clinical Narrative:                 Patient is Spanish Speaking and has been confused, patient has a history of dementia.  Currently the patient is living with his brother Kirtland Bouchard.  Kirtland Bouchard has been the patient's primary caregiver but Kirtland Bouchard reports that he is having trouble taking care of his brother, Kirtland Bouchard states "he is getting worse and worse".  Kirtland Bouchard reports that the patient cannot do anything for himself, he cannot feed himself or go to the bathroom.  Patient is malnourished and cachectic in appearance.  Patient does not have insurance and does not have a PCP.   Resources were given to patient on last admission in May but patient and brother have not followed up.   RNCM will follow and assist with discharge needs.    Expected Discharge Plan: [place] Barriers to Discharge: Continued Medical Work up   Patient Goals and CMS Choice Patient states their goals for this hospitalization and ongoing recovery are:: Patient's brother does not want him discharged until the patient can at least eat by himself      Expected Discharge Plan and Services Expected Discharge Plan: [place]   Discharge Planning Services: CM Consult   Living arrangements for the past 2 months: Single Family Home                                      Prior Living Arrangements/Services Living arrangements for the past 2 months: Single Family Home Lives with:: Siblings(Brother Kirtland Bouchard) Patient language and need for interpreter reviewed:: Yes(Spanish)        Need for Family Participation in Patient Care: Yes (Comment) Care giver support system in place?: Yes (comment)(has a brother)   Criminal Activity/Legal Involvement Pertinent to Current  Situation/Hospitalization: No - Comment as needed  Activities of Daily Living      Permission Sought/Granted                  Emotional Assessment Appearance:: Appears stated age, Disheveled Attitude/Demeanor/Rapport: Unable to Assess     Alcohol / Substance Use: Not Applicable Psych Involvement: No (comment)  Admission diagnosis:  Subdural hematoma ([place]) [S06.5X9A] HCAP (healthcare-associated pneumonia) [J18.9] Sepsis, due to unspecified organism, unspecified whether acute organ dysfunction present [facility]) [A41.9] Patient Active Problem List   Diagnosis Date Noted  . LLL pneumonia ([place]) 02/13/2019  . Other pancytopenia ([place]) 01/09/2019  . Vitamin B12 deficiency 01/09/2019  . Altered mental status 01/07/2019   PCP:  Patient, No Pcp Per Pharmacy:   [facility] [address], [place] [address] Phone: [phone] Fax: [place] [address]), [place]) [place] [address] Phone: [phone] Fax: [phone]     Social Determinants of Health (SDOH) Interventions    Readmission Risk Interventions No flowsheet data found.

## 2019-02-14 NOTE — Progress Notes (Signed)
Nutrition Initial Assessment   DOCUMENTATION CODES:   Underweight  INTERVENTION:   Ensure Enlive po TID, each supplement provides 350 kcal and 20 grams of protein  Magic cup TID with meals, each supplement provides 290 kcal and 9 grams of protein  MVI daily   Dysphagia 3 diet   Pt at high refeed risk; recommend monitor K, Mg and P labs daily once oral intake improves.   NUTRITION DIAGNOSIS:   Inadequate oral intake related to acute illness as evidenced by per patient/family report.  GOAL:   Patient will meet greater than or equal to 90% of their needs  MONITOR:   PO intake, Supplement acceptance, Labs, Weight trends, Skin, I & O's  REASON FOR ASSESSMENT:   Consult Assessment of nutrition requirement/status  ASSESSMENT:   68 y.o. male with a known history of dementia, hypertension, and pancytopenia.  Patient is being admitted for subdural hematoma, sepsis due to pneumonia.  RD working remotely.  Unable to speak with pt r/t dementia and AMS. Per chart review, family reports pt with poor appetite and oral intake for 2 weeks pta. Pt also noted to have failure to thrive. There is no recent weight history in chart to determine if any significant weight loss. RD will add vitamins and supplements to help pt meet his estimated needs. Will also change pt to a dysphagia 3 diet. Pt likely at high refeed risk; recommend monitor electrolytes.   Pt at high risk for malnutrition but unable to diagnose at this time as NFPE cannot be performed.   Medications reviewed and include: protonix, B12, Vitamin D, NaCl @125ml /hr, azithromycin, ceftriaxone   Labs reviewed: Hgb 8.3(L), Hct 27.1(L)  Unable to complete Nutrition-Focused physical exam at this time.   Diet Order:   Diet Order            DIET DYS 3 Room service appropriate? No; Fluid consistency: Thin  Diet effective now             EDUCATION NEEDS:   Not appropriate for education at this time  Skin:  Skin Assessment:  Reviewed RN Assessment  Last BM:  7/2  Height:   Ht Readings from Last 1 Encounters:  01/06/19 5\' 8"  (1.727 m)    Weight:   Wt Readings from Last 1 Encounters:  02/12/19 53.5 kg    Ideal Body Weight:  70 kg  BMI:  Body mass index is 17.93 kg/m.  Estimated Nutritional Needs:   Kcal:  1700-2000kcal/day  Protein:  85-100g/day  Fluid:  >1.4L/day  Koleen Distance MS, RD, LDN Pager #- [phone] Office#- [phone] After Hours Pager: [phone]

## 2019-02-14 NOTE — Progress Notes (Signed)
[place] [facility] [place] NAME: Ryan Matthews    MR#:  [id number]  DATE OF BIRTH:  1951-04-05  SUBJECTIVE:  CHIEF COMPLAINT:   Chief Complaint  Patient presents with  . Code Sepsis   The patient is confused.  He has no complaints.  On oxygen by nasal cannula 2 L. REVIEW OF SYSTEMS:  Review of Systems  Unable to perform ROS: Mental status change    DRUG ALLERGIES:  No Known Allergies VITALS:  Blood pressure (!) 146/96, pulse (!) 101, temperature 97.7 F (36.5 C), resp. rate 16, weight 53.5 kg, SpO2 91 %. PHYSICAL EXAMINATION:  Physical Exam Constitutional:      Comments: Severe malnutrition.  HENT:     Head: Normocephalic.     Mouth/Throat:     Mouth: Mucous membranes are moist.  Eyes:     General: No scleral icterus.    Conjunctiva/sclera: Conjunctivae normal.     Pupils: Pupils are equal, round, and reactive to light.  Neck:     Musculoskeletal: Normal range of motion and neck supple.     Vascular: No JVD.     Trachea: No tracheal deviation.  Cardiovascular:     Rate and Rhythm: Normal rate and regular rhythm.     Heart sounds: Normal heart sounds. No murmur. No gallop.   Pulmonary:     Effort: Pulmonary effort is normal. No respiratory distress.     Breath sounds: Normal breath sounds. No wheezing or rales.  Abdominal:     General: Bowel sounds are normal. There is no distension.     Palpations: Abdomen is soft.     Tenderness: There is no abdominal tenderness. There is no rebound.  Musculoskeletal: Normal range of motion.        General: No tenderness.     Right lower leg: No edema.     Left lower leg: No edema.  Skin:    Findings: No erythema or rash.  Neurological:     Mental Status: He is alert and oriented to person, place, and time.     Cranial Nerves: No cranial nerve deficit.  Psychiatric:     Comments: Confused.    LABORATORY PANEL:  Male CBC Recent Labs  Lab 02/14/19 0549  WBC 7.4  HGB 8.3*   HCT 27.1*  PLT 95*   ------------------------------------------------------------------------------------------------------------------ Chemistries  Recent Labs  Lab 02/12/19 2057 02/13/19 0441  NA 136 139  K 3.8 3.9  CL 111 114*  CO2 19* 18*  GLUCOSE 98 87  BUN 15 17  CREATININE 0.90 0.90  CALCIUM 7.1* 7.1*  AST 84*  --   ALT 39  --   ALKPHOS 246*  --   BILITOT 0.7  --    RADIOLOGY:  No results found. ASSESSMENT AND PLAN:   1.  Sepsis due to pneumonia. Continue antibiotics and IV fluid support, follow-up cultures.  2.  Acute respiratory failure with hypoxia due to Left lower lobe pneumonia- possibly HCAP with recent hospitalization 1 month ago for altered mental status - Treatment initiated with azithromycin and Rocephin -Sputum culture pending --DuoNeb every 6 hours as needed for shortness of breath or cough. Try to wean off oxygen.  3.  Subdural hematoma - Dr. Izora Ribas with neurosurgery consulted by the ED physician with orders to keep platelet count above 75,000 with current platelet count at 79,000 and INR less than 1.3. Repeated CT of the head report a stable subdural hematoma.  Per Dr. Cari Caraway, no  more imaging.  4.    Acute metabolic encephalopathy due to respiratory failure, sepsis and pneumonia. Aspiration the fall precaution.  Anemia of chronic disease.  Stable. Generalized weakness.  PT evaluation. All the records are reviewed and case discussed with Ryan Matthews. Management plans discussed with the patient, family and they are in agreement.  CODE STATUS: Full Code  TOTAL TIME TAKING Ryan OF THIS PATIENT: 35 minutes.   More than 50% of the time was spent in counseling/coordination of Ryan: YES  POSSIBLE D/C IN 2-3 DAYS, DEPENDING ON CLINICAL CONDITION.   Ryan Matthews M.D on 02/14/2019 at 2:08 PM  Between 7am to 6pm - Pager - [phone]  After 6pm go to [email or web address] - Ryan Matthews

## 2019-02-14 NOTE — TOC Progression Note (Signed)
Transition of Care [facility]) - Progression Note    Patient Details  Name: Ryan Matthews MRN: [id number] Date of Birth: Feb 11, 1951  [facility] CM/SW Contact  Shelbie Hutching, RN Phone Number: 02/14/2019, 1:07 PM  Clinical Narrative:     Report made to APS for concerns of patient's welfare.  RNCM after speaking with the patient's brother has concerns that the patient's brother is not able to adequately care for him.    Expected Discharge Plan: [place] Barriers to Discharge: Continued Medical Work up  Expected Discharge Plan and Services Expected Discharge Plan: [place]   Discharge Planning Services: CM Consult   Living arrangements for the past 2 months: Single Family Home                                       Social Determinants of Health (SDOH) Interventions    Readmission Risk Interventions No flowsheet data found.

## 2019-02-14 NOTE — Progress Notes (Addendum)
PT Cancellation Note  Patient Details Name: Ryan Matthews MRN: [id number] DOB: 06/01/1951   Cancelled Treatment:    Reason Eval/Treat Not Completed: Other (comment)(PT attempted to initiate evaluation with interpreter, PT noticed significant RUE compared to LUE. PT also answering questions innappropriately. RN and MD notified of R arm swelling.)   Lieutenant Diego PT, DPT [phone] PM,02/14/19 [phone]

## 2019-02-15 DIAGNOSIS — F329 Major depressive disorder, single episode, unspecified: Secondary | ICD-10-CM | POA: Diagnosis present

## 2019-02-15 DIAGNOSIS — F32A Depression, unspecified: Secondary | ICD-10-CM | POA: Diagnosis present

## 2019-02-15 DIAGNOSIS — F320 Major depressive disorder, single episode, mild: Secondary | ICD-10-CM

## 2019-02-15 LAB — BASIC METABOLIC PANEL
Anion gap: 3 — ABNORMAL LOW (ref 5–15)
BUN: 14 mg/dL (ref 8–23)
CO2: 21 mmol/L — ABNORMAL LOW (ref 22–32)
Calcium: 7.1 mg/dL — ABNORMAL LOW (ref 8.9–10.3)
Chloride: 118 mmol/L — ABNORMAL HIGH (ref 98–111)
Creatinine, Ser: 0.69 mg/dL (ref 0.61–1.24)
GFR calc Af Amer: >60 mL/min (ref >60)
GFR calc non Af Amer: >60 mL/min (ref >60)
Glucose, Bld: 100 mg/dL — ABNORMAL HIGH (ref 70–99)
Potassium: 3.1 mmol/L — ABNORMAL LOW (ref 3.5–5.1)
Sodium: 142 mmol/L (ref 135–145)

## 2019-02-15 LAB — CBC
HCT: 22.8 % — ABNORMAL LOW (ref 39.0–52.0)
Hemoglobin: 7.2 g/dL — ABNORMAL LOW (ref 13.0–17.0)
MCH: 27.7 pg (ref 26.0–34.0)
MCHC: 31.6 g/dL (ref 30.0–36.0)
MCV: 87.7 fL (ref 80.0–100.0)
Platelets: 80 10*3/uL — ABNORMAL LOW (ref 150–400)
RBC: 2.6 MIL/uL — ABNORMAL LOW (ref 4.22–5.81)
RDW: 19.7 % — ABNORMAL HIGH (ref 11.5–15.5)
WBC: 3.9 10*3/uL — ABNORMAL LOW (ref 4.0–10.5)
nRBC: 0 % (ref 0.0–0.2)

## 2019-02-15 LAB — HIV ANTIBODY (ROUTINE TESTING W REFLEX): HIV Screen 4th Generation wRfx: NONREACTIVE

## 2019-02-15 LAB — MAGNESIUM: Magnesium: 1.9 mg/dL (ref 1.7–2.4)

## 2019-02-15 MED ORDER — MIRTAZAPINE 15 MG PO TABS
7.5000 mg | ORAL_TABLET | Freq: Every day | ORAL | Status: DC
  Administered 2019-02-15 – 2019-02-17 (×2): 7.5 mg via ORAL
  Filled 2019-02-15 (×2): qty 1

## 2019-02-15 MED ORDER — POTASSIUM CHLORIDE 10 MEQ/100ML IV SOLN
10.0000 meq | INTRAVENOUS | Status: AC
  Administered 2019-02-15 – 2019-02-16 (×2): 10 meq via INTRAVENOUS
  Filled 2019-02-15: qty 100

## 2019-02-15 NOTE — Consult Note (Signed)
[facility]   Reason for Consult:  "Suspected depression" Referring Physician:  Dr Imogene Burnhen Patient Identification: Ryan Matthews MRN:  [id number] Principal Diagnosis: Sepsis due to pneumonia Diagnosis:  Active Problems:   Depression   LLL pneumonia (HCC)  Total Time spent with patient: 1 hour  Subjective:   Ryan Matthews is a 68 y.o. male patient admitted with sepsis r/t pneumonia, history of dementia and depression.  Interpreter (Spanish) utilized for interview.  He reports everything as "fine".  When asked what he had for breakfast, he said "Sweet potatoes."  His RN was in the room and stated he has not been eating and did not eat breakfast. When she reminded him of this, he said "If you say I'm not, I'm not."  Difficult to obtain an accurate history related to his current cognitive state and metabolic encephalopathy with confusion.  HPI:  68 y.o. male with a known history of dementia, hypertension, and pancytopenia.  Patient lives with his brother who is his primary caregiver.  He was brought to the emergency room for evaluation of confusion with altered mental status as well as a persistent hacking cough and fever.  Patient was noted by his brother to have decreased p.o. intake over the last 2 weeks with increasing confusion and weakness.  EMS reported patient being found soaked with urine and a large bowel movement.  Patient was given 650 mg Tylenol prior to arrival with subjective fever according to EMS documentation.  Past Psychiatric History: dementia  Risk to Self:  none Risk to Others:  none Prior Inpatient Therapy:  none Prior Outpatient Therapy:  none  Past Medical History:  Past Medical History:  Diagnosis Date  . Dementia (HCC)   . Hypertension   . Other pancytopenia (HCC) 01/09/2019   History reviewed. No pertinent surgical history. Family History: No family history on file. Family Psychiatric  History: none Social History:  Social History    Substance and Sexual Activity  Alcohol Use Not Currently     Social History   Substance and Sexual Activity  Drug Use Not Currently    Social History   Socioeconomic History  . Marital status: Single    Spouse name: Not on file  . Number of children: Not on file  . Years of education: Not on file  . Highest education level: Not on file  Occupational History  . Not on file  Social Needs  . Financial resource strain: Not on file  . Food insecurity    Worry: Not on file    Inability: Not on file  . Transportation needs    Medical: Not on file    Non-medical: Not on file  Tobacco Use  . Smoking status: Not on file  Substance and Sexual Activity  . Alcohol use: Not Currently  . Drug use: Not Currently  . Sexual activity: Not Currently  Lifestyle  . Physical activity    Days per week: Not on file    Minutes per session: Not on file  . Stress: Not on file  Relationships  . Social Musicianconnections    Talks on phone: Not on file    Gets together: Not on file    Attends religious service: Not on file    Active member of club or organization: Not on file    Attends meetings of clubs or organizations: Not on file    Relationship status: Not on file  Other Topics Concern  . Not on file  Social History Narrative  .  Not on file   Additional Social History:    Allergies:  No Known Allergies  Labs:  Results for orders placed or performed during the hospital encounter of 02/12/19 (from the past 48 hour(s))  CBC     Status: Abnormal   Collection Time: 02/14/19  5:49 AM  Result Value Ref Range   WBC 7.4 4.0 - 10.5 K/uL   RBC 3.00 (L) 4.22 - 5.81 MIL/uL   Hemoglobin 8.3 (L) 13.0 - 17.0 g/dL   HCT 27.1 (L) 39.0 - 52.0 %   MCV 90.3 80.0 - 100.0 fL   MCH 27.7 26.0 - 34.0 pg   MCHC 30.6 30.0 - 36.0 g/dL   RDW 19.8 (H) 11.5 - 15.5 %   Platelets 95 (L) 150 - 400 K/uL    Comment: Immature Platelet Fraction may be clinically indicated, consider ordering this additional  test [id number]    nRBC 0.0 0.0 - 0.2 %    Comment: Performed at [facility], [address] [place], [place] [address]  CBC     Status: Abnormal   Collection Time: 02/15/19  8:29 AM  Result Value Ref Range   WBC 3.9 (L) 4.0 - 10.5 K/uL   RBC 2.60 (L) 4.22 - 5.81 MIL/uL   Hemoglobin 7.2 (L) 13.0 - 17.0 g/dL   HCT 22.8 (L) 39.0 - 52.0 %   MCV 87.7 80.0 - 100.0 fL   MCH 27.7 26.0 - 34.0 pg   MCHC 31.6 30.0 - 36.0 g/dL   RDW 19.7 (H) 11.5 - 15.5 %   Platelets 80 (L) 150 - 400 K/uL    Comment: Immature Platelet Fraction may be clinically indicated, consider ordering this additional test [id number]    nRBC 0.0 0.0 - 0.2 %    Comment: Performed at [facility], [address] [place], [place] [address]    Current Facility-Administered Medications  Medication Dose Route Frequency Provider Last Rate Last Dose  . 0.9 %  sodium chloride infusion   Intravenous Continuous Demetrios Loll, MD   Stopped at 02/15/19 0259  . azithromycin (ZITHROMAX) 500 mg in sodium chloride 0.9 % 250 mL IVPB  500 mg Intravenous Q24H Seals, Angela H, NP 150 mL/hr at 02/15/19 0400    . bisacodyl (DULCOLAX) EC tablet 5 mg  5 mg Oral Daily PRN Demetrios Loll, MD      . cefTRIAXone (ROCEPHIN) 2 g in sodium chloride 0.9 % 100 mL IVPB  2 g Intravenous Q24H Seals, Angela H, NP 200 mL/hr at 02/15/19 0520 2 g at 02/15/19 0520  . feeding supplement (ENSURE ENLIVE) (ENSURE ENLIVE) liquid 237 mL  1 Bottle Oral TID BM Seals, Angela H, NP   237 mL at 02/15/19 1029  . guaiFENesin (ROBITUSSIN) 100 MG/5ML solution 100 mg  5 mL Oral Q4H PRN Demetrios Loll, MD   100 mg at 02/13/19 1224  . multivitamin with minerals tablet 1 tablet  1 tablet Oral Daily Demetrios Loll, MD   1 tablet at 02/14/19 1817  . ondansetron (ZOFRAN) injection 4 mg  4 mg Intravenous Q6H PRN Demetrios Loll, MD      . pantoprazole (PROTONIX) EC tablet 40 mg  40 mg Oral Daily Seals, Angela H, NP   40 mg at 02/15/19 1039  . senna (SENOKOT) tablet 8.6 mg  1 tablet  Oral Daily PRN Demetrios Loll, MD      . vitamin B-12 (CYANOCOBALAMIN) tablet 1,000 mcg  1,000 mcg Oral Daily Seals, Theo Dills, NP   1,000  mcg at 02/15/19 1029  . Vitamin D (Ergocalciferol) (DRISDOL) capsule 50,000 Units  50,000 Units Oral Q7 days Pearletha AlfredSeals, Angela H, NP   50,000 Units at 02/13/19 1036    Musculoskeletal: Strength & Muscle Tone: decreased Gait & Station: did not witness Patient leans: Right  Psychiatric Specialty Exam: Physical Exam  Nursing note and vitals reviewed. Constitutional: He appears well-developed.  HENT:  Head: Normocephalic.  Neck: Normal range of motion.  Respiratory: Effort normal.  Neurological: He is alert.  Psychiatric: His speech is normal and behavior is normal. Judgment and thought content normal. His affect is blunt. Cognition and memory are impaired. He exhibits a depressed mood.    Review of Systems  Psychiatric/Behavioral: Positive for depression and memory loss.  All other systems reviewed and are negative.   Blood pressure 117/80, pulse 96, temperature (!) 97.5 F (36.4 C), resp. rate 20, weight 53.5 kg, SpO2 100 %.Body mass index is 17.93 kg/m.  General Appearance: Casual  Eye Contact:  Fair  Speech:  Normal Rate  Volume:  Normal  Mood:  Anxious and Depressed  Affect:  Blunt  Thought Process:  Coherent  Orientation:  Other:  self and place  Thought Content:  confused at times  Suicidal Thoughts:  No  Homicidal Thoughts:  No  Memory:  Immediate;   Poor Recent;   Poor Remote;   Poor  Judgement:  Impaired  Insight:  Lacking  Psychomotor Activity:  Decreased  Concentration:  Concentration: Fair and Attention Span: Fair  Recall:  Poor  Fund of Knowledge:  Fair  Language:  Fair  Akathisia:  No  Handed:  Right  AIMS (if indicated):     Assets:  Leisure Time Resilience Social Support  ADL's:  Impaired  Cognition:  Impaired,  Moderate  Sleep:        Treatment Plan Summary: Daily contact with patient to assess and evaluate  symptoms and progress in treatment, Medication management and Plan depression:  -Started Remeron 7.5 mg at bedtime  Dementia: -Follow up with neurology  Disposition: No evidence of imminent risk to self or others at present.   Patient does not meet criteria for psychiatric inpatient admission. Supportive therapy provided about ongoing stressors.  Nanine MeansJamison Valene Villa, NP 02/15/2019 11:35 AM

## 2019-02-15 NOTE — Progress Notes (Signed)
At approximately 2030 Notified Gardiner Barefoot, NP to request order for telemetry as pnt's HR has been between 115-125. Telemetry monitor put in place.  Seals requested BMP and Mg. Notified Seals of lab abnormals and waiting for orders at this time.

## 2019-02-15 NOTE — Progress Notes (Addendum)
[employer] - [place] [facility]   PATIENT NAME: Ryan SayresSergio Prestage    MR#:  [id number]  DATE OF BIRTH:  04/01/1951  SUBJECTIVE:  CHIEF COMPLAINT:   Chief Complaint  Patient presents with  . Code Sepsis   The patient is confused. On oxygen by nasal cannula 3 L.  The patient has poor oral intake per RN. REVIEW OF SYSTEMS:  Review of Systems  Unable to perform ROS: Mental status change    DRUG ALLERGIES:  No Known Allergies VITALS:  Blood pressure 117/80, pulse 96, temperature (!) 97.5 F (36.4 C), resp. rate 20, weight 53.5 kg, SpO2 100 %. PHYSICAL EXAMINATION:  Physical Exam Constitutional:      General: He is not in acute distress.    Appearance: He is ill-appearing.     Comments: Severe malnutrition.  HENT:     Head: Normocephalic.  Eyes:     General: No scleral icterus.    Conjunctiva/sclera: Conjunctivae normal.     Pupils: Pupils are equal, round, and reactive to light.  Neck:     Musculoskeletal: Neck supple.     Vascular: No JVD.     Trachea: No tracheal deviation.  Cardiovascular:     Rate and Rhythm: Normal rate and regular rhythm.     Heart sounds: Normal heart sounds. No murmur. No gallop.   Pulmonary:     Effort: Pulmonary effort is normal. No respiratory distress.     Breath sounds: Normal breath sounds. No wheezing or rales.  Abdominal:     General: Bowel sounds are normal. There is no distension.     Palpations: Abdomen is soft.     Tenderness: There is no abdominal tenderness. There is no rebound.  Musculoskeletal:        General: No tenderness.     Right lower leg: No edema.     Left lower leg: No edema.  Skin:    Findings: No erythema or rash.  Neurological:     Mental Status: He is alert.     Comments: The patient does not follow command, unable to exam.  Psychiatric:     Comments: Confused.    LABORATORY PANEL:  Male CBC Recent Labs  Lab 02/15/19 0829  WBC 3.9*  HGB 7.2*  HCT 22.8*  PLT 80*    ------------------------------------------------------------------------------------------------------------------ Chemistries  Recent Labs  Lab 02/12/19 2057 02/13/19 0441  NA 136 139  K 3.8 3.9  CL 111 114*  CO2 19* 18*  GLUCOSE 98 87  BUN 15 17  CREATININE 0.90 0.90  CALCIUM 7.1* 7.1*  AST 84*  --   ALT 39  --   ALKPHOS 246*  --   BILITOT 0.7  --    RADIOLOGY:  [place] Venous Img Upper Uni Right  Result Date: 02/14/2019 CLINICAL DATA:  68 year old male with right upper extremity swelling EXAM: RIGHT UPPER EXTREMITY VENOUS DOPPLER ULTRASOUND TECHNIQUE: Gray-scale sonography with graded compression, as well as color Doppler and duplex ultrasound were performed to evaluate the upper extremity deep venous system from the level of the subclavian vein and including the jugular, axillary, basilic, radial, ulnar and upper cephalic vein. Spectral Doppler was utilized to evaluate flow at rest and with distal augmentation maneuvers. COMPARISON:  None. FINDINGS: Contralateral Subclavian Vein: Respiratory phasicity is normal and symmetric with the symptomatic side. No evidence of thrombus. Normal compressibility. Internal Jugular Vein: No evidence of thrombus. Normal compressibility, respiratory phasicity and response to augmentation. Subclavian Vein: No evidence of thrombus. Normal compressibility,  respiratory phasicity and response to augmentation. Axillary Vein: No evidence of thrombus. Normal compressibility, respiratory phasicity and response to augmentation. Cephalic Vein: No evidence of thrombus. Normal compressibility, respiratory phasicity and response to augmentation. Basilic Vein: No evidence of thrombus. Normal compressibility, respiratory phasicity and response to augmentation. Brachial Veins: No evidence of thrombus. Normal compressibility, respiratory phasicity and response to augmentation. Radial Veins: No evidence of thrombus. Normal compressibility, respiratory phasicity and response to  augmentation. Ulnar Veins: No evidence of thrombus. Normal compressibility, respiratory phasicity and response to augmentation. Venous Reflux:  None visualized. Other Findings:  Extensive superficial subcutaneous edema. IMPRESSION: No evidence of DVT within the right upper extremity. Electronically Signed   By: Jacqulynn Cadet M.D.   On: 02/14/2019 14:37   ASSESSMENT AND PLAN:   1.  Sepsis due to pneumonia. Continue antibiotics and IV fluid support, blood cultures negative so far.  2.  Acute respiratory failure with hypoxia due to Left lower lobe pneumonia- possibly HCAP with recent hospitalization 1 month ago for altered mental status - Treatment initiated with azithromycin and Rocephin -Sputum culture pending --DuoNeb every 6 hours as needed for shortness of breath or cough. Try to wean off oxygen.  3.  Subdural hematoma - Dr. Izora Ribas with neurosurgery consulted by the ED physician with orders to keep platelet count above 75,000 with current platelet count at 79,000 and INR less than 1.3. Repeated CT of the head report a stable subdural hematoma.  Per Dr. Cari Caraway, no more imaging.  4.    Acute metabolic encephalopathy due to respiratory failure, sepsis and pneumonia. Aspiration the fall precaution.  Anemia of chronic disease.  Hemoglobin is low.  Stable. Generalized weakness.  PT evaluation. Severe malnutrition.  Dietitian consult. Patient has poor prognosis.  Palliative care consult. All the records are reviewed and case discussed with Care Management/Social Worker. Management plans discussed with the patient, family and they are in agreement.  CODE STATUS: Full Code  TOTAL TIME TAKING CARE OF THIS PATIENT: 32 minutes.   More than 50% of the time was spent in counseling/coordination of care: YES  POSSIBLE D/C IN 3 DAYS, DEPENDING ON CLINICAL CONDITION.   Demetrios Loll M.D on 02/15/2019 at 12:31 PM  Between 7am to 6pm - Pager - [phone]  After 6pm go to [email or web address]  - [facility]

## 2019-02-15 NOTE — Evaluation (Signed)
Physical Therapy Evaluation Patient Details Name: Ryan Matthews MRN: [id number] DOB: 04/05/1951 Today's Date: 02/15/2019   History of Present Illness  Ryan Matthews is a 67yo hispanic white male who [facility] with fever, weakness, cough, and poor PO intake x2weeks per family, admitted with sepsis related to LLL PNA. Pt also found to have stable SDH neurosurgical consult appreciated. Pt presented with AMS not responding to questions in a meaningful way via interpreter.  Clinical Impression  Pt admitted with above diagnosis. Pt currently with functional limitations due to the deficits listed below (see "PT Problem List"). Upon entry, pt in bed, drowsy but rousable, and generally interactive/greeable to participate.  The pt is disoriented to self repeatedly he answers most questions, but clearly continues to be altered. He has intermittent difficulty following simple commands for mobility. minA to get to EOB, he appears weak with difficulty sitting upright and independently. HR 126 while seated EOB (should be correlated with low H&H), but SpO2:100% on room air. Min-modA to STS and SPT to chair, pt very unsteady and weak in legs. Overall awareness of the situation is quite poor, pt simply reporting each time that he feels good and that he has no pain. Pt denies hunger, refuses lunch tray multiple times. Functional mobility assessment demonstrates increased effort/time requirements, poor tolerance, and need for physical assistance, whereas the patient performed these at a higher level of independence PTA. Pt left up in chair, made comfortable, RN made aware. Pt will benefit from skilled PT intervention to increase independence and safety with basic mobility in preparation for discharge to the venue listed below.       Follow Up Recommendations SNF    Equipment Recommendations  Other (comment)(unclear at this time)    Recommendations for Other Services       Precautions /  Restrictions Precautions Precautions: Fall Precaution Comments: AMS c safety mittens donned Restrictions Weight Bearing Restrictions: No      Mobility  Bed Mobility Overal bed mobility: Needs Assistance Bed Mobility: Supine to Sit     Supine to sit: Min assist     General bed mobility comments: some trunk weakness, difficuly sitting for >60sec bouts; at EOB, HR 126bpm  Transfers Overall transfer level: Needs assistance Equipment used: 1 person hand held assist Transfers: Sit to/from [employer]GI CorporationStand;Stand Pivot Transfers Sit to Stand: Mod assist Stand pivot transfers: Mod assist       General transfer comment: very unsteady/weak, difficulty following commands when asked.  Ambulation/Gait Ambulation/Gait assistance: (not appropriate at this time d/t cognition and HR)              Stairs            Wheelchair Mobility    Modified Rankin (Stroke Patients Only)       Balance Overall balance assessment: Needs assistance Sitting-balance support: Feet supported       Standing balance support: During functional activity;Bilateral upper extremity supported Standing balance-Leahy Scale: Poor                               Pertinent Vitals/Pain Pain Assessment: No/denies pain    Home Living Family/patient expects to be discharged to:: Private residence Living Arrangements: Other relatives(brother [name]) Available Help at Discharge: Family;Available 24 hours/day Type of Home: House Home Access: Stairs to enter Entrance Stairs-Rails: Right;Left;Can reach both Entrance Stairs-Number of Steps: 5 Home Layout: One level Home Equipment: Walker - 2 wheels(unclear if this  is acurate)      Prior Function           Comments: Pt Ind with amb limited community distances with no fall history, Ind with ADLs 1 MA     Hand Dominance        Extremity/Trunk Assessment   Upper Extremity Assessment Upper Extremity Assessment: Generalized weakness     Lower Extremity Assessment Lower Extremity Assessment: Generalized weakness       Communication   Communication: Prefers language other than English  Cognition Arousal/Alertness: Lethargic Behavior During Therapy: Flat affect(inappropriat verbal respones, poor command following) Overall Cognitive Status: Impaired/Different from baseline Area of Impairment: Problem solving;Awareness;Safety/judgement;Following commands;Orientation                 Orientation Level: Disoriented to;Person;Place;Time;Situation     Following Commands: Follows one step commands inconsistently Safety/Judgement: Decreased awareness of safety;Decreased awareness of deficits Awareness: Emergent;Anticipatory Problem Solving: Slow processing;Decreased initiation;Difficulty sequencing;Requires verbal cues;Requires tactile cues        General Comments      Exercises     Assessment/Plan    PT Assessment Patient needs continued PT services  PT Problem List Decreased strength;Decreased cognition;Decreased range of motion;Decreased activity tolerance;Decreased balance;Decreased mobility;Decreased coordination       PT Treatment Interventions DME instruction;Balance training;Gait training;Neuromuscular re-education;Stair training;Cognitive remediation;Patient/family education;Functional mobility training;Therapeutic activities;Therapeutic exercise    PT Goals (Current goals can be found in the Care Plan section)  Acute Rehab PT Goals PT Goal Formulation: Patient unable to participate in goal setting Time For Goal Achievement: 03/01/19 Potential to Achieve Goals: Fair    Frequency Min 2X/week   Barriers to discharge Decreased caregiver support questions regarding brother's ability to provide sufficient care for patient, APS now involved.    Co-evaluation               AM-PAC PT "6 Clicks" Mobility  Outcome Measure Help needed turning from your back to your side while in a flat bed  without using bedrails?: A Little Help needed moving from lying on your back to sitting on the side of a flat bed without using bedrails?: A Little Help needed moving to and from a bed to a chair (including a wheelchair)?: A Lot Help needed standing up from a chair using your arms (e.g., wheelchair or bedside chair)?: A Lot Help needed to walk in hospital room?: Total Help needed climbing 3-5 steps with a railing? : Total 6 Click Score: 12    End of Session   Activity Tolerance: Patient tolerated treatment well Patient left: in chair;with chair alarm set;with SCD's reapplied;Other (comment)(mittens donned) Nurse Communication: Mobility status(bed is soiled) PT Visit Diagnosis: Unsteadiness on feet (R26.81);Other abnormalities of gait and mobility (R26.89);Muscle weakness (generalized) (M62.81);Other symptoms and signs involving the nervous system (R29.898);Difficulty in walking, not elsewhere classified (R26.2)    Time: 1310-1330 PT Time Calculation (min) (ACUTE ONLY): 20 min   Charges:   PT Evaluation $PT Eval Moderate Complexity: 1 Mod PT Treatments $Therapeutic Exercise: 8-22 mins        3:40 PM, 02/15/19 Etta Grandchild, PT, DPT Physical Therapist - [facility]  [phone] ([place])    [place] 02/15/2019, 3:32 PM

## 2019-02-16 LAB — BASIC METABOLIC PANEL WITH GFR
Anion gap: 2 — ABNORMAL LOW (ref 5–15)
BUN: 13 mg/dL (ref 8–23)
CO2: 19 mmol/L — ABNORMAL LOW (ref 22–32)
Calcium: 6.9 mg/dL — ABNORMAL LOW (ref 8.9–10.3)
Chloride: 119 mmol/L — ABNORMAL HIGH (ref 98–111)
Creatinine, Ser: 0.71 mg/dL (ref 0.61–1.24)
GFR calc Af Amer: >60 mL/min (ref >60)
GFR calc non Af Amer: >60 mL/min (ref >60)
Glucose, Bld: 82 mg/dL (ref 70–99)
Potassium: 3 mmol/L — ABNORMAL LOW (ref 3.5–5.1)
Sodium: 140 mmol/L (ref 135–145)

## 2019-02-16 LAB — CBC
HCT: 23.8 % — ABNORMAL LOW (ref 39.0–52.0)
Hemoglobin: 7.4 g/dL — ABNORMAL LOW (ref 13.0–17.0)
MCH: 27.5 pg (ref 26.0–34.0)
MCHC: 31.1 g/dL (ref 30.0–36.0)
MCV: 88.5 fL (ref 80.0–100.0)
Platelets: 79 10*3/uL — ABNORMAL LOW (ref 150–400)
RBC: 2.69 MIL/uL — ABNORMAL LOW (ref 4.22–5.81)
RDW: 19.9 % — ABNORMAL HIGH (ref 11.5–15.5)
WBC: 3.3 10*3/uL — ABNORMAL LOW (ref 4.0–10.5)
nRBC: 0 % (ref 0.0–0.2)

## 2019-02-16 LAB — OCCULT BLOOD X 1 CARD TO LAB, STOOL: Fecal Occult Bld: POSITIVE — AB

## 2019-02-16 LAB — MAGNESIUM: Magnesium: 2 mg/dL (ref 1.7–2.4)

## 2019-02-16 MED ORDER — PNEUMOCOCCAL VAC POLYVALENT 25 MCG/0.5ML IJ INJ
0.5000 mL | INJECTION | INTRAMUSCULAR | Status: AC
  Administered 2019-02-17: 0.5 mL via INTRAMUSCULAR
  Filled 2019-02-16: qty 0.5

## 2019-02-16 MED ORDER — POTASSIUM CHLORIDE 10 MEQ/100ML IV SOLN
10.0000 meq | INTRAVENOUS | Status: AC
  Administered 2019-02-16 (×4): 10 meq via INTRAVENOUS
  Filled 2019-02-16 (×4): qty 100

## 2019-02-16 MED ORDER — SODIUM CHLORIDE 0.9 % IV SOLN
INTRAVENOUS | Status: DC | PRN
  Administered 2019-02-16: 01:00:00 50 mL via INTRAVENOUS

## 2019-02-16 NOTE — Progress Notes (Signed)
[place] [facility] [place] NAME: Ryan Matthews    MR#:  [id number]  DATE OF BIRTH:  [birth year]  SUBJECTIVE:  CHIEF COMPLAINT:   Chief Complaint  Patient presents with  . Code Sepsis   The patient is confused. Off oxygen..  The patient still has poor oral intake per RN. REVIEW OF SYSTEMS:  Review of Systems  Unable to perform ROS: Mental status change    DRUG ALLERGIES:  No Known Allergies VITALS:  Blood pressure (!) 133/92, pulse 95, temperature 98 F (36.7 C), resp. rate 16, weight 53.5 kg, SpO2 99 %. PHYSICAL EXAMINATION:  Physical Exam Constitutional:      General: He is not in acute distress.    Appearance: He is ill-appearing.     Comments: Severe malnutrition.  HENT:     Head: Normocephalic.  Eyes:     General: No scleral icterus.    Conjunctiva/sclera: Conjunctivae normal.     Pupils: Pupils are equal, round, and reactive to light.  Neck:     Musculoskeletal: Neck supple.     Vascular: No JVD.     Trachea: No tracheal deviation.  Cardiovascular:     Rate and Rhythm: Normal rate and regular rhythm.     Heart sounds: Normal heart sounds. No murmur. No gallop.   Pulmonary:     Effort: Pulmonary effort is normal. No respiratory distress.     Breath sounds: Normal breath sounds. No wheezing or rales.  Abdominal:     General: Bowel sounds are normal. There is no distension.     Palpations: Abdomen is soft.     Tenderness: There is no abdominal tenderness. There is no rebound.  Musculoskeletal:        General: No tenderness.     Right lower leg: No edema.     Left lower leg: No edema.  Skin:    Findings: No erythema or rash.  Neurological:     Mental Status: He is alert.     Comments: The patient does not follow command, unable to exam.  Psychiatric:     Comments: Confused.    LABORATORY PANEL:  Male CBC Recent Labs  Lab 02/16/19 0558  WBC 3.3*  HGB 7.4*  HCT 23.8*  PLT 79*    ------------------------------------------------------------------------------------------------------------------ Chemistries  Recent Labs  Lab 02/12/19 2057  02/16/19 0558  NA 136   < > 140  K 3.8   < > 3.0*  CL 111   < > 119*  CO2 19*   < > 19*  GLUCOSE 98   < > 82  BUN 15   < > 13  CREATININE 0.90   < > 0.71  CALCIUM 7.1*   < > 6.9*  MG  --    < > 2.0  AST 84*  --   --   ALT 39  --   --   ALKPHOS 246*  --   --   BILITOT 0.7  --   --    < > = values in this interval not displayed.   RADIOLOGY:  No results found. ASSESSMENT AND PLAN:   1.  Sepsis due to pneumonia. Continue antibiotics and IV fluid support, blood cultures negative so far.  2.  Acute respiratory failure with hypoxia due to Left lower lobe pneumonia- possibly HCAP with recent hospitalization 1 month ago for altered mental status - Treatment initiated with azithromycin and Rocephin -Sputum culture pending --DuoNeb every 6 hours as needed for  shortness of breath or cough. Try to wean off oxygen.  3.  Subdural hematoma - Dr. Myer HaffYarbrough with neurosurgery consulted by the ED physician with orders to keep platelet count above 75,000 with current platelet count at 79,000 and INR less than 1.3. Repeated CT of the head report a stable subdural hematoma.  Per Dr. Marcell BarlowYarborough, no more imaging.  Pancytopenia is stable.  4.    Acute metabolic encephalopathy due to respiratory failure, sepsis and pneumonia. Aspiration the fall precaution. Hypokalemia.  Potassium supplement.  Magnesium level is normal. Anemia of chronic disease.  Hemoglobin is low.  Stable. Generalized weakness.  PT evaluation. Severe malnutrition.  Follow dietitian's recommendation. Patient has poor prognosis.  Palliative care consult. All the records are reviewed and case discussed with Care Management/Social Worker. Management plans discussed with the patient's brother and they are in agreement.  CODE STATUS: Full Code  TOTAL TIME TAKING CARE  OF THIS PATIENT: 30 minutes.   More than 50% of the time was spent in counseling/coordination of care: YES  POSSIBLE D/C IN 2 DAYS, DEPENDING ON CLINICAL CONDITION.   Shaune PollackQing Kinsey Karch M.D on 02/16/2019 at 9:39 AM  Between 7am to 6pm - Pager - [phone]  After 6pm go to [email or web address] - Therapist, nutritionalpassword EPAS [facility]  [employer]  Hospitalists

## 2019-02-17 DIAGNOSIS — F321 Major depressive disorder, single episode, moderate: Secondary | ICD-10-CM

## 2019-02-17 LAB — CULTURE, BLOOD (ROUTINE X 2)
Culture: NO GROWTH
Culture: NO GROWTH
Special Requests: ADEQUATE
Special Requests: ADEQUATE

## 2019-02-17 LAB — POTASSIUM: Potassium: 3.3 mmol/L — ABNORMAL LOW (ref 3.5–5.1)

## 2019-02-17 MED ORDER — FOLIC ACID 1 MG PO TABS
1.0000 mg | ORAL_TABLET | Freq: Every day | ORAL | Status: DC
  Administered 2019-02-17 – 2019-02-19 (×3): 1 mg via ORAL
  Filled 2019-02-17 (×3): qty 1

## 2019-02-17 MED ORDER — LORAZEPAM 1 MG PO TABS
1.0000 mg | ORAL_TABLET | Freq: Four times a day (QID) | ORAL | Status: DC | PRN
  Administered 2019-02-18 – 2019-02-19 (×3): 1 mg via ORAL
  Filled 2019-02-17 (×3): qty 1

## 2019-02-17 MED ORDER — HALOPERIDOL LACTATE 5 MG/ML IJ SOLN
1.0000 mg | Freq: Four times a day (QID) | INTRAMUSCULAR | Status: DC | PRN
  Administered 2019-02-17: 15:00:00 1 mg via INTRAVENOUS
  Filled 2019-02-17: qty 1

## 2019-02-17 MED ORDER — SODIUM CHLORIDE 0.9 % IV SOLN
2.0000 g | INTRAVENOUS | Status: AC
  Administered 2019-02-18 – 2019-02-19 (×2): 2 g via INTRAVENOUS
  Filled 2019-02-17 (×2): qty 2

## 2019-02-17 MED ORDER — CYANOCOBALAMIN 1000 MCG/ML IJ SOLN
1000.0000 ug | Freq: Every day | INTRAMUSCULAR | Status: DC
  Administered 2019-02-17 – 2019-02-19 (×3): 1000 ug via INTRAMUSCULAR
  Filled 2019-02-17 (×3): qty 1

## 2019-02-17 MED ORDER — VITAMIN C 500 MG PO TABS
500.0000 mg | ORAL_TABLET | Freq: Two times a day (BID) | ORAL | Status: DC
  Administered 2019-02-17 – 2019-02-19 (×3): 500 mg via ORAL
  Filled 2019-02-17 (×5): qty 1

## 2019-02-17 MED ORDER — LORAZEPAM 2 MG/ML IJ SOLN
1.0000 mg | Freq: Four times a day (QID) | INTRAMUSCULAR | Status: DC | PRN
  Administered 2019-02-17: 1 mg via INTRAVENOUS
  Filled 2019-02-17: qty 1

## 2019-02-17 MED ORDER — ADULT MULTIVITAMIN W/MINERALS CH: 1.0000 | ORAL_TABLET | Freq: Every day | ORAL | Status: DC

## 2019-02-17 MED ORDER — THIAMINE HCL 100 MG/ML IJ SOLN
100.0000 mg | Freq: Every day | INTRAMUSCULAR | Status: DC
  Administered 2019-02-17 – 2019-02-19 (×3): 100 mg via INTRAVENOUS
  Filled 2019-02-17 (×3): qty 2

## 2019-02-17 NOTE — TOC Progression Note (Signed)
Transition of Care [facility]) - Progression Note    Patient Details  Name: Ryan Matthews MRN: [id number] Date of Birth: June 29, 1951  [facility] CM/SW Contact  Shelbie Hutching, RN Phone Number: 02/17/2019, 3:32 PM  Clinical Narrative:    APS social worker in to see patient for evaluation after report filed this past Friday.  Case Worker is [employer] copy number [phone] cell number [phone].  Financial counseling will also follow up with patient.  At this time resident/citizenship status is not confirmed.  If patient is not a documented resident he will not qualify for Medicaid or any other social services. Discharge plan at this time is undetermined.      Expected Discharge Plan: [place] Barriers to Discharge: Continued Medical Work up  Expected Discharge Plan and Services Expected Discharge Plan: [place]   Discharge Planning Services: CM Consult   Living arrangements for the past 2 months: Single Family Home                                       Social Determinants of Health (SDOH) Interventions    Readmission Risk Interventions No flowsheet data found.

## 2019-02-17 NOTE — Progress Notes (Signed)
Patient ID: Ryan Matthews, male   DOB: 28-Mar-1951, 68 y.o.   MRN: [id number]  [employer] PROGRESS NOTE  Umair Rosiles FIE:[id number] DOB: 1951-02-09 DOA: 02/12/2019 PCP: Patient, No Pcp Per  HPI/Subjective: Patient does not offer any complaints.  He states he feels cold.  As per the nurse when it is hot in the room then he takes off all his clothes and the blanket and throws them on the floor.  Patient not having any pain.  Objective: Vitals:   02/17/19 0917 02/17/19 1430  BP: (!) 145/99 (!) 154/98  Pulse: 93 (!) 119  Resp:  16  Temp:  98.4 F (36.9 C)  SpO2:  100%    Intake/Output Summary (Last 24 hours) at 02/17/2019 1433 Last data filed at 02/17/2019 1407 Gross per 24 hour  Intake 1672.47 ml  Output 3550 ml  Net -1877.53 ml   Filed Weights   02/12/19 2100 02/12/19 2205  Weight: 53.5 kg 53.5 kg    ROS: Review of Systems  Constitutional: Negative for chills and fever.  Eyes: Negative for blurred vision.  Respiratory: Negative for cough and shortness of breath.   Cardiovascular: Negative for chest pain.  Gastrointestinal: Negative for abdominal pain, constipation, diarrhea, nausea and vomiting.  Genitourinary: Negative for dysuria.  Musculoskeletal: Negative for joint pain.  Neurological: Negative for dizziness and headaches.   Exam: Physical Exam  HENT:  Nose: No mucosal edema.  Mouth/Throat: No oropharyngeal exudate or posterior oropharyngeal edema.  Eyes: Pupils are equal, round, and reactive to light. Conjunctivae, EOM and lids are normal.  Neck: No JVD present. Carotid bruit is not present. No edema present. No thyroid mass and no thyromegaly present.  Cardiovascular: S1 normal and S2 normal. Exam reveals no gallop.  No murmur heard. Pulses:      Dorsalis pedis pulses are 2+ on the right side and 2+ on the left side.  Respiratory: No respiratory distress. He has no wheezes. He has no rhonchi. He has no rales.  GI: Soft. Bowel sounds are normal. There is  no abdominal tenderness.  Musculoskeletal:     Right ankle: He exhibits no swelling.     Left ankle: He exhibits no swelling.  Lymphadenopathy:    He has no cervical adenopathy.  Neurological: He is alert.  Skin: Skin is warm. Nails show no clubbing.  Brownish discoloration fingers bilateral hands  Psychiatric: He has a normal mood and affect.      Data Reviewed: Basic Metabolic Panel: Recent Labs  Lab 02/12/19 2057 02/13/19 0441 02/15/19 2107 02/16/19 0558 02/17/19 0518  NA 136 139 142 140  --   K 3.8 3.9 3.1* 3.0* 3.3*  CL 111 114* 118* 119*  --   CO2 19* 18* 21* 19*  --   GLUCOSE 98 87 100* 82  --   BUN _0 --   CREATININE 0.90 0.90 0.69 0.71  --   CALCIUM 7.1* 7.1* 7.1* 6.9*  --   MG  --   --  1.9 2.0  --    Liver Function Tests: Recent Labs  Lab 02/12/19 2057  AST 84*  ALT 39  ALKPHOS 246*  BILITOT 0.7  PROT 6.8  ALBUMIN 1.6*   Recent Labs  Lab 02/12/19 2057  LIPASE 28   Recent Labs  Lab 02/12/19 2147  AMMONIA 14   CBC: Recent Labs  Lab 02/12/19 2057 02/13/19 0441 02/14/19 0549 02/15/19 0829 02/16/19 0558  WBC 6.1 5.2 7.4 3.9* 3.3*  NEUTROABS 5.1  --   --   --   --  HGB 8.1* 7.4* 8.3* 7.2* 7.4*  HCT 25.5* 24.2* 27.1* 22.8* 23.8*  MCV 86.4 88.0 90.3 87.7 88.5  PLT 79* 73* 95* 80* 79*     Recent Results (from the past 240 hour(s))  Blood Culture (routine x 2)     Status: None   Collection Time: 02/12/19  8:57 PM   Specimen: BLOOD  Result Value Ref Range Status   Specimen Description BLOOD RIGHT ANTECUBITAL  Final   Special Requests   Final    BOTTLES DRAWN AEROBIC AND ANAEROBIC Blood Culture adequate volume   Culture   Final    NO GROWTH 5 DAYS Performed at [facility], [address] [place], [place] [address]    Report Status 02/17/2019 FINAL  Final  Blood Culture (routine x 2)     Status: None   Collection Time: 02/12/19  8:57 PM   Specimen: BLOOD  Result Value Ref Range Status   Specimen Description  BLOOD BLOOD LEFT FOREARM  Final   Special Requests   Final    BOTTLES DRAWN AEROBIC AND ANAEROBIC Blood Culture adequate volume   Culture   Final    NO GROWTH 5 DAYS Performed at [facility], [address], [place], [place] [address]    Report Status 02/17/2019 FINAL  Final  SARS Coronavirus 2 (CEPHEID- Performed in [place] hospital lab), Hosp Order     Status: None   Collection Time: 02/12/19  8:57 PM   Specimen: Urine, Catheterized; Nasopharyngeal  Result Value Ref Range Status   SARS Coronavirus 2 NEGATIVE NEGATIVE Final    Comment: (NOTE) If result is NEGATIVE SARS-CoV-2 target nucleic acids are NOT DETECTED. The SARS-CoV-2 RNA is generally detectable in upper and lower  respiratory specimens during the acute phase of infection. The lowest  concentration of SARS-CoV-2 viral copies this assay can detect is 250  copies / mL. A negative result does not preclude SARS-CoV-2 infection  and should not be used as the sole basis for treatment or other  patient management decisions.  A negative result may occur with  improper specimen collection / handling, submission of specimen other  than nasopharyngeal swab, presence of viral mutation(s) within the  areas targeted by this assay, and inadequate number of viral copies  (<250 copies / mL). A negative result must be combined with clinical  observations, patient history, and epidemiological information. If result is POSITIVE SARS-CoV-2 target nucleic acids are DETECTED. The SARS-CoV-2 RNA is generally detectable in upper and lower  respiratory specimens dur ing the acute phase of infection.  Positive  results are indicative of active infection with SARS-CoV-2.  Clinical  correlation with patient history and other diagnostic information is  necessary to determine patient infection status.  Positive results do  not rule out bacterial infection or co-infection with other viruses. If result is PRESUMPTIVE POSTIVE SARS-CoV-2  nucleic acids MAY BE PRESENT.   A presumptive positive result was obtained on the submitted specimen  and confirmed on repeat testing.  While 2019 novel coronavirus  (SARS-CoV-2) nucleic acids may be present in the submitted sample  additional confirmatory testing may be necessary for epidemiological  and / or clinical management purposes  to differentiate between  SARS-CoV-2 and other Sarbecovirus currently known to infect humans.  If clinically indicated additional testing with an alternate test  methodology [id number]) is advised. The SARS-CoV-2 RNA is generally  detectable in upper and lower respiratory sp ecimens during the acute  phase of infection. The expected result is  Negative. Fact Sheet for Patients:  StrictlyIdeas.no Fact Sheet for Healthcare Providers: BankingDealers.co.za This test is not yet approved or cleared by the [place] FDA and has been authorized for detection and/or diagnosis of SARS-CoV-2 by FDA under an Emergency Use Authorization (EUA).  This EUA will remain in effect (meaning this test can be used) for the duration of the COVID-19 declaration under Section 564(b)(1) of the Act, 21 U.S.C. section 360bbb-3(b)(1), unless the authorization is terminated or revoked sooner. Performed at [facility], [address] [place], [place] [address]       Scheduled Meds: . cyanocobalamin  1,000 mcg Intramuscular Daily  . feeding supplement (ENSURE ENLIVE)  1 Bottle Oral TID BM  . mirtazapine  7.5 mg Oral QHS  . multivitamin with minerals  1 tablet Oral Daily  . pantoprazole  40 mg Oral Daily  . vitamin C  500 mg Oral BID  . Vitamin D (Ergocalciferol)  50,000 Units Oral Q7 days   Continuous Infusions: . sodium chloride 50 mL/hr at 02/17/19 1407  . sodium chloride Stopped (02/16/19 0810)  . [START ON 02/18/2019] cefTRIAXone (ROCEPHIN)  IV      Assessment/Plan:  1. Sepsis secondary to pneumonia.   Zithromax will finish up today.  Continue Rocephin for 7-day total. 2. Acute hypoxic respiratory failure.  Patient tapered off oxygen. 3. Subdural hematoma.  Seen by neurosurgery.  Continue physical therapy. 4. Pancytopenia.  Seen by oncology last visit.  Patient did not follow-up as outpatient.  We will continue to replace B12.  May end up needing a bone marrow biopsy as outpatient.  Seen by gastroenterology on the last hospital stay and they did not want to do any procedures. 5. Failure to thrive.  Started on Remeron by psychiatry. 6. Positive ANA and double-stranded DNA and lesions on fingers.  Will get rheumatology consultation for opinion. 7. Vitamin D deficiency replace vitamin D 8. Vitamin C deficiency replace vitamin C 9. We will empirically give IV thiamine 10. Palliative care consultation  Code Status:     Code Status Orders  (From admission, onward)         Start     Ordered   02/13/19 0208  Full code  Continuous     02/13/19 0207        Code Status History    Date Active Date Inactive Code Status Order ID Comments User Context   01/07/2019 0317 01/09/2019 1941 Full Code [id number]  Mayer Camel, NP ED   Advance Care Planning Activity     Family Communication: Spoke with brother on the phone Disposition Plan: To be determined  Time spent: 32 minutes, including ACP time  [employer]

## 2019-02-17 NOTE — Progress Notes (Signed)
Patient ID: Ryan Matthews, male   DOB: 07/17/1951, 68 y.o.   MRN: [id number]  ACP note.  Patient states he feels fine.  Spoke with the patient's brother on the phone.  I explained to the patient's brother that I am concerned about him not eating.  If he does not eat he will not survive. He was started on some Remeron to build up appetite.  I told him I can treat pneumonia.  I will not be able to treat him if he does not eat.  I will continue to replace B12 and vitamins.  I will get a palliative care consultation because the patient is not eating and his overall prognosis is poor.  The patient is still currently a full code.  I did speak with the patient's brother about his prognosis being poor especially if he does not eat.  The patient's brother states that he has been eating liquids at home.  Here in the hospital he needs to be fed.  He has had periods of confusion.  Time spent on ACP discussion 17 minutes Dr Loletha Grayer

## 2019-02-17 NOTE — Consult Note (Signed)
[place] [facility] Consult   Reason for Consult:  "Suspected depression" Referring Physician:  Dr Bridgett Larsson Patient Identification: Ryan Matthews MRN:  [id number] Principal Diagnosis: Sepsis due to pneumonia Diagnosis:  Active Problems:   Depression   LLL pneumonia ([place])  Total Time spent with patient: 15 minutes  Subjective:   Ryan Matthews is a 68 y.o. male patient admitted with sepsis r/t pneumonia, history of dementia and depression. Continues to report everything as fine.  His RN was occupied and not available for collateral.    HPI:  68 y.o. male with a known history of dementia, hypertension, and pancytopenia.  Patient lives with his brother who is his primary caregiver.  He was brought to the emergency room for evaluation of confusion with altered mental status as well as a persistent hacking cough and fever.  Patient was noted by his brother to have decreased p.o. intake over the last 2 weeks with increasing confusion and weakness.  EMS reported patient being found soaked with urine and a large bowel movement.  Patient was given 650 mg Tylenol prior to arrival with subjective fever according to EMS documentation.  Past Psychiatric History: dementia  Risk to Self:  none Risk to Others:  none Prior Inpatient Therapy:  none Prior Outpatient Therapy:  none  Past Medical History:  Past Medical History:  Diagnosis Date  . Dementia ([place])   . Hypertension   . Other pancytopenia ([place]) 01/09/2019   History reviewed. No pertinent surgical history. Family History: No family history on file. Family Psychiatric  History: none Social History:  Social History   Substance and Sexual Activity  Alcohol Use Not Currently     Social History   Substance and Sexual Activity  Drug Use Not Currently    Social History   Socioeconomic History  . Marital status: Single    Spouse name: Not on file  . Number of children: Not on file  . Years of education: Not on file  . Highest  education level: Not on file  Occupational History  . Not on file  Social Needs  . Financial resource strain: Not on file  . Food insecurity    Worry: Not on file    Inability: Not on file  . Transportation needs    Medical: Not on file    Non-medical: Not on file  Tobacco Use  . Smoking status: Not on file  Substance and Sexual Activity  . Alcohol use: Not Currently  . Drug use: Not Currently  . Sexual activity: Not Currently  Lifestyle  . Physical activity    Days per week: Not on file    Minutes per session: Not on file  . Stress: Not on file  Relationships  . Social Herbalist on phone: Not on file    Gets together: Not on file    Attends religious service: Not on file    Active member of club or organization: Not on file    Attends meetings of clubs or organizations: Not on file    Relationship status: Not on file  Other Topics Concern  . Not on file  Social History Narrative  . Not on file   Additional Social History:    Allergies:  No Known Allergies  Labs:  Results for orders placed or performed during the hospital encounter of 02/12/19 (from the past 48 hour(s))  Basic metabolic panel     Status: Abnormal   Collection Time: 02/15/19  9:07 PM  Result Value  Ref Range   Sodium 142 135 - 145 mmol/L   Potassium 3.1 (L) 3.5 - 5.1 mmol/L   Chloride 118 (H) 98 - 111 mmol/L   CO2 21 (L) 22 - 32 mmol/L   Glucose, Bld 100 (H) 70 - 99 mg/dL   BUN 14 8 - 23 mg/dL   Creatinine, Ser 1.610.69 0.61 - 1.24 mg/dL   Calcium 7.1 (L) 8.9 - 10.3 mg/dL   GFR calc non Af Amer >60 >60 mL/min   GFR calc Af Amer >60 >60 mL/min   Anion gap 3 (L) 5 - 15    Comment: Performed at [facility], [address] [place], [place] [address]  Magnesium     Status: None   Collection Time: 02/15/19  9:07 PM  Result Value Ref Range   Magnesium 1.9 1.7 - 2.4 mg/dL    Comment: Performed at [facility], [address] [place], [place] [address]  Basic metabolic  panel     Status: Abnormal   Collection Time: 02/16/19  5:58 AM  Result Value Ref Range   Sodium 140 135 - 145 mmol/L    Comment: LYTES REPEATED MJU   Potassium 3.0 (L) 3.5 - 5.1 mmol/L   Chloride 119 (H) 98 - 111 mmol/L   CO2 19 (L) 22 - 32 mmol/L   Glucose, Bld 82 70 - 99 mg/dL   BUN 13 8 - 23 mg/dL   Creatinine, Ser 1.190.71 0.61 - 1.24 mg/dL   Calcium 6.9 (L) 8.9 - 10.3 mg/dL   GFR calc non Af Amer >60 >60 mL/min   GFR calc Af Amer >60 >60 mL/min   Anion gap 2 (L) 5 - 15    Comment: Performed at [facility], [address] [place], [place] [address]  Magnesium     Status: None   Collection Time: 02/16/19  5:58 AM  Result Value Ref Range   Magnesium 2.0 1.7 - 2.4 mg/dL    Comment: Performed at [facility], [address] [place], [place] [address]  CBC     Status: Abnormal   Collection Time: 02/16/19  5:58 AM  Result Value Ref Range   WBC 3.3 (L) 4.0 - 10.5 K/uL   RBC 2.69 (L) 4.22 - 5.81 MIL/uL   Hemoglobin 7.4 (L) 13.0 - 17.0 g/dL   HCT 30.823.8 (L) 65.739.0 - 84.652.0 %   MCV 88.5 80.0 - 100.0 fL   MCH 27.5 26.0 - 34.0 pg   MCHC 31.1 30.0 - 36.0 g/dL   RDW 96.219.9 (H) 95.211.5 - 84.115.5 %   Platelets 79 (L) 150 - 400 K/uL    Comment: Immature Platelet Fraction may be clinically indicated, consider ordering this additional test [id number]    nRBC 0.0 0.0 - 0.2 %    Comment: Performed at [facility], [address] [place], [place] [address]  Occult blood card to lab, stool RN will collect     Status: Abnormal   Collection Time: 02/16/19 11:19 AM  Result Value Ref Range   Fecal Occult Bld POSITIVE (A) NEGATIVE    Comment: Performed at [facility], [address] [place], [place] [address]  Potassium     Status: Abnormal   Collection Time: 02/17/19  5:18 AM  Result Value Ref Range   Potassium 3.3 (L) 3.5 - 5.1 mmol/L    Comment: Performed at [facility], [address] [place], [place] [address]    Current Facility-Administered  Medications  Medication  Dose Route Frequency Provider Last Rate Last Dose  . 0.9 %  sodium chloride infusion   Intravenous Continuous Shaune Pollackhen, Qing, MD 50 mL/hr at 02/17/19 1216    . 0.9 %  sodium chloride infusion   Intravenous PRN Shaune Pollackhen, Qing, MD   Stopped at 02/16/19 [phone]  . bisacodyl (DULCOLAX) EC tablet 5 mg  5 mg Oral Daily PRN Shaune Pollackhen, Qing, MD      . Melene Muller[START ON 02/18/2019] cefTRIAXone (ROCEPHIN) 2 g in sodium chloride 0.9 % 100 mL IVPB  2 g Intravenous Q24H Wieting, Richard, MD      . cyanocobalamin ((VITAMIN B-12)) injection 1,000 mcg  1,000 mcg Intramuscular Daily Wieting, Richard, MD      . feeding supplement (ENSURE ENLIVE) (ENSURE ENLIVE) liquid 237 mL  1 Bottle Oral TID BM Seals, Angela H, NP   237 mL at 02/15/19 1812  . guaiFENesin (ROBITUSSIN) 100 MG/5ML solution 100 mg  5 mL Oral Q4H PRN Shaune Pollackhen, Qing, MD   100 mg at 02/16/19 0138  . mirtazapine (REMERON) tablet 7.5 mg  7.5 mg Oral QHS Charm RingsLord, Jamison Y, NP   7.5 mg at 02/15/19 2249  . multivitamin with minerals tablet 1 tablet  1 tablet Oral Daily Shaune Pollackhen, Qing, MD   1 tablet at 02/15/19 1812  . ondansetron (ZOFRAN) injection 4 mg  4 mg Intravenous Q6H PRN Shaune Pollackhen, Qing, MD      . pantoprazole (PROTONIX) EC tablet 40 mg  40 mg Oral Daily Seals, Milas Kocherngela H, NP   40 mg at 02/17/19 0911  . senna (SENOKOT) tablet 8.6 mg  1 tablet Oral Daily PRN Shaune Pollackhen, Qing, MD   8.6 mg at 02/17/19 0911  . vitamin C (ASCORBIC ACID) tablet 500 mg  500 mg Oral BID Alford HighlandWieting, Richard, MD      . Vitamin D (Ergocalciferol) (DRISDOL) capsule 50,000 Units  50,000 Units Oral Q7 days Pearletha AlfredSeals, Angela H, NP   50,000 Units at 02/13/19 1036    Musculoskeletal: Strength & Muscle Tone: decreased Gait & Station: did not witness Patient leans: Right  Psychiatric Specialty Exam: Physical Exam  Nursing note and vitals reviewed. Constitutional: He appears well-developed.  HENT:  Head: Normocephalic.  Neck: Normal range of motion.  Respiratory: Effort normal.  Neurological: He is alert.   Psychiatric: His speech is normal and behavior is normal. Judgment and thought content normal. His affect is blunt. Cognition and memory are impaired. He exhibits a depressed mood.    Review of Systems  Psychiatric/Behavioral: Positive for depression and memory loss.  All other systems reviewed and are negative.   Blood pressure (!) 145/99, pulse 93, temperature 97.9 F (36.6 C), resp. rate 18, weight 53.5 kg, SpO2 93 %.Body mass index is 17.93 kg/m.  General Appearance: Casual  Eye Contact:  Fair  Speech:  Normal Rate  Volume:  Normal  Mood:  Anxious and Depressed  Affect:  Blunt  Thought Process:  Coherent  Orientation:  Other:  self and place  Thought Content:  confused at times  Suicidal Thoughts:  No  Homicidal Thoughts:  No  Memory:  Immediate;   Poor Recent;   Poor Remote;   Poor  Judgement:  Impaired  Insight:  Lacking  Psychomotor Activity:  Decreased  Concentration:  Concentration: Fair and Attention Span: Fair  Recall:  Poor  Fund of Knowledge:  Fair  Language:  Fair  Akathisia:  No  Handed:  Right  AIMS (if indicated):     Assets:  Leisure Time Resilience Social Support  ADL's:  Impaired  Cognition:  Impaired,  Moderate  Sleep:        Treatment Plan Summary: Daily contact with patient to assess and evaluate symptoms and progress in treatment, Medication management and Plan depression:  -Continued Remeron 7.5 mg at bedtime  Dementia: -Follow up with neurology  Disposition: No evidence of imminent risk to self or others at present.   Patient does not meet criteria for psychiatric inpatient admission. Supportive therapy provided about ongoing stressors.  Nanine MeansJamison Lord, NP 02/17/2019 1:37 PM

## 2019-02-17 NOTE — Progress Notes (Signed)
   02/17/19 1529  Vitals  Temp 98.4 F (36.9 C)  Temp Source Oral  BP (!) 154/104  MAP (mmHg) 121  BP Location Left Arm  BP Method Automatic  Patient Position (if appropriate) Lying  Pulse Rate (!) 129  MEWS Score  MEWS RR 0  MEWS Pulse 2  MEWS Systolic 0  MEWS LOC 0  MEWS Temp 0  MEWS Score 2  MEWS Score Color Yellow  Made Dr. Leslye Peer aware of current BP & HR.  No new orders at this time.  Also made Dr. Leslye Peer aware that patient is having tremors like someone who has ETOH abuse.  New orders for CIWA protocol.  Patient urine is also pink in condom cath bag with a small clot, Dr. Leslye Peer is aware.  [name], BSN

## 2019-02-17 NOTE — Progress Notes (Signed)
PT Cancellation Note  Patient Details Name: Ryan Matthews MRN: [id number] DOB: 04-Apr-1951   Cancelled Treatment:    Reason Eval/Treat Not Completed: Medical issues which prohibited therapy. Per nursing HR/BP levels high this afternoon; pt also having tremors. Defer treatment at this time. Re attempt tomorrow.    Larae Grooms, PTA 02/17/2019, 4:58 PM

## 2019-02-17 NOTE — Plan of Care (Signed)

## 2019-02-18 DIAGNOSIS — Z7189 Other specified counseling: Secondary | ICD-10-CM

## 2019-02-18 DIAGNOSIS — Z515 Encounter for palliative care: Secondary | ICD-10-CM

## 2019-02-18 DIAGNOSIS — J189 Pneumonia, unspecified organism: Secondary | ICD-10-CM

## 2019-02-18 DIAGNOSIS — A419 Sepsis, unspecified organism: Principal | ICD-10-CM

## 2019-02-18 LAB — CBC
HCT: 25.6 % — ABNORMAL LOW (ref 39.0–52.0)
Hemoglobin: 8 g/dL — ABNORMAL LOW (ref 13.0–17.0)
MCH: 27.2 pg (ref 26.0–34.0)
MCHC: 31.3 g/dL (ref 30.0–36.0)
MCV: 87.1 fL (ref 80.0–100.0)
Platelets: 73 10*3/uL — ABNORMAL LOW (ref 150–400)
RBC: 2.94 MIL/uL — ABNORMAL LOW (ref 4.22–5.81)
RDW: 19.5 % — ABNORMAL HIGH (ref 11.5–15.5)
WBC: 3.1 10*3/uL — ABNORMAL LOW (ref 4.0–10.5)
nRBC: 0 % (ref 0.0–0.2)

## 2019-02-18 LAB — COMPREHENSIVE METABOLIC PANEL
ALT: 45 U/L — ABNORMAL HIGH (ref 0–44)
AST: 81 U/L — ABNORMAL HIGH (ref 15–41)
Albumin: 1.4 g/dL — ABNORMAL LOW (ref 3.5–5.0)
Alkaline Phosphatase: 274 U/L — ABNORMAL HIGH (ref 38–126)
Anion gap: 6 (ref 5–15)
BUN: 10 mg/dL (ref 8–23)
CO2: 21 mmol/L — ABNORMAL LOW (ref 22–32)
Calcium: 7 mg/dL — ABNORMAL LOW (ref 8.9–10.3)
Chloride: 110 mmol/L (ref 98–111)
Creatinine, Ser: 0.65 mg/dL (ref 0.61–1.24)
GFR calc Af Amer: >60 mL/min (ref >60)
GFR calc non Af Amer: >60 mL/min (ref >60)
Glucose, Bld: 72 mg/dL (ref 70–99)
Potassium: 3.2 mmol/L — ABNORMAL LOW (ref 3.5–5.1)
Sodium: 137 mmol/L (ref 135–145)
Total Bilirubin: 0.5 mg/dL (ref 0.3–1.2)
Total Protein: 6.1 g/dL — ABNORMAL LOW (ref 6.5–8.1)

## 2019-02-18 LAB — TYPE AND SCREEN
ABO/RH(D): O POS
Antibody Screen: NEGATIVE

## 2019-02-18 LAB — CK: Total CK: 82 U/L (ref 49–397)

## 2019-02-18 LAB — C-REACTIVE PROTEIN: CRP: 3 mg/dL — ABNORMAL HIGH (ref <1.0)

## 2019-02-18 LAB — PROTEIN / CREATININE RATIO, URINE
Creatinine, Urine: 44 mg/dL
Protein Creatinine Ratio: 1.61 mg/mg{Cre} — ABNORMAL HIGH (ref 0.00–0.15)
Total Protein, Urine: 71 mg/dL

## 2019-02-18 MED ORDER — POTASSIUM CHLORIDE 20 MEQ PO PACK
20.0000 meq | PACK | Freq: Two times a day (BID) | ORAL | Status: DC
  Administered 2019-02-18: 16:00:00 20 meq via ORAL
  Filled 2019-02-18 (×2): qty 1

## 2019-02-18 MED ORDER — MIRTAZAPINE 15 MG PO TABS
15.0000 mg | ORAL_TABLET | Freq: Every day | ORAL | Status: DC
  Administered 2019-02-19: 15 mg via ORAL
  Filled 2019-02-18 (×2): qty 1

## 2019-02-18 MED ORDER — POTASSIUM CHLORIDE CRYS ER 20 MEQ PO TBCR
40.0000 meq | EXTENDED_RELEASE_TABLET | Freq: Once | ORAL | Status: AC
  Administered 2019-02-18: 40 meq via ORAL
  Filled 2019-02-18: qty 2

## 2019-02-18 MED ORDER — METOPROLOL TARTRATE 5 MG/5ML IV SOLN
5.0000 mg | INTRAVENOUS | Status: DC | PRN
  Administered 2019-02-18: 16:00:00 5 mg via INTRAVENOUS
  Filled 2019-02-18: qty 5

## 2019-02-18 NOTE — Consult Note (Signed)
Reason for Consult: Abnormal fingers.  Positive ANA  Referring Physician: Hospitalist  Navid Lenzen   HPI: 68 year old Hispanic male.  Unable to obtain history from patient.  Patient was obtunded and poorly arousable.  History from chart.  Patient had admission in May for sudden onset of mental status changes and was found to be pancytopenic.  He had target cells and teardrops with low white count platelets and hemoglobin.  12 was 143.  Had elevated transaminases and low albumin and elevated alk phos.  Abdominal CT did not show hepatomegaly or any malignancy.  Did have mild proteinuria at that time During that hospitalization he was noted to have finger lesions.  ANA was drawn.  Positive.  Positive anti-DNA.  Anemic.  Was seen by GI for abnormal liver functions.  Anti-smooth muscle antibody was mildly elevated.   He was discharged on B12 supplementation.  Did not follow-up with hematology.  Readmitted after decreased mental status.  Was still pancytopenic.  Had CT of the brain showing small subdural hematoma not expanding.  Left lower lobe infiltrate.  Has been on antibiotics.  Has had supplementation of vitamin D and C and thiamine.  Low albumin at 1.4.  Elevated transaminases.  Mildly elevated retake count.  Ferritin normal urinalysis shows large hemoglobin 100 mg/mL of protein.  Red blood cells 20-50  Unable to obtain history of prior raynauds, photosensitive rash, prior serositis or joint pain  PMH: Unobtainable  SURGICAL HISTORY: Unobtainable  Family History: Unobtainable  Social History: Unobtainable  Allergies: No Known Allergies  Medications:  Scheduled: . cyanocobalamin  1,000 mcg Intramuscular Daily  . feeding supplement (ENSURE ENLIVE)  1 Bottle Oral TID BM  . folic acid  1 mg Oral Daily  . mirtazapine  15 mg Oral QHS  . multivitamin with minerals  1 tablet Oral Daily  . pantoprazole  40 mg Oral Daily  . potassium chloride  20 mEq Oral BID  . thiamine injection  100  mg Intravenous Daily  . vitamin C  500 mg Oral BID  . Vitamin D (Ergocalciferol)  50,000 Units Oral Q7 days        ROS: Unobtainable   PHYSICAL EXAM: Blood pressure (!) 133/93, pulse (!) 106, temperature 98.8 F (37.1 C), resp. rate 20, weight 53.5 kg, SpO2 100 %. Pounded.  Arouses but does not really communicate.  Sleeping.  No acute distress.  No obvious facial rash.  No discoid lesions.  Fingers have some cyanosis.  Splinter hemorrhages.  Some plaques.  Some erythema.  No definite sclerodactyly.  Good carotid upstroke.  No subclavian or carotid bruits.  No aortic or femoral bruits.  Radial pulses intact.  Chest with the crackles left and right base.  No definite rub or murmur.  Nontender abdomen.  No visceromegaly.  Trace edema.toes without splinter hemorrhages Musculoskeletal.  Moves shoulders elbows well.  Hands without definite synovitis and mild puffiness of the left arm in general.  Hips move well.  No knee effusions neurologic: Cannot test muscle strength.  Hyporeflexic.  Does not follow commands  Assessment: Encephalopathy Small subdural hematoma Pancytopenia.  Cannot rule out myelodysplasia.  Cannot rule out immune related pancytopenia Left lower lobe infiltrate Microscopic hematuria.  Proteinuria Abnormal liver functions malNutrition with low albumin Low B12 with parietal cell antibodies Abnormal hands with cyanosis, splinter hemorrhages and plaques. Could be part of a systemic inflammatory disease like dermatomyositis or vasculitis or lupus.  Recommendations: Labs sent.  C3, C4, CPK, sed rate, CRP, lupus anticoagulant and anticardiolipin antibodies  Urine protein to creatinine ratio A complete work-up would include bone marrow, echocardiogram, nephrology opinion regarding  hematuria and proteinuria ,and neurology opinion regarding encephalopathy and possible LP/EEG/MRI  Emmaline Kluver 02/18/2019, 6:52 PM

## 2019-02-18 NOTE — Progress Notes (Signed)
Patient ID: Ryan Matthews, male   DOB: 1951-02-17, 68 y.o.   MRN: [id number]  Patient ID: Ryan Matthews, male   DOB: 02-01-1951, 68 y.o.   MRN: [id number]  [facility] PROGRESS NOTE  Ryan Matthews VXY:[id number] DOB: September 17, 1950 DOA: 02/12/2019 PCP: Patient, No Pcp Per  HPI/Subjective: Patient does not offer any complaints.  He states he feels cold.  As per the nurse when it is hot in the room then he takes off all his clothes and the blanket and throws them on the floor.  Patient not having any pain.  Objective: Vitals:   02/18/19 0851 02/18/19 1458  BP: (!) 150/98 (!) 126/96  Pulse: (!) 122 (!) 134  Resp:  20  Temp:  98.8 F (37.1 C)  SpO2:  100%    Intake/Output Summary (Last 24 hours) at 02/18/2019 1513 Last data filed at 02/18/2019 1108 Gross per 24 hour  Intake 1108.71 ml  Output 1650 ml  Net -541.29 ml   Filed Weights   02/12/19 2100 02/12/19 2205  Weight: 53.5 kg 53.5 kg    ROS: Review of Systems  Constitutional: Negative for chills and fever.  Eyes: Negative for blurred vision.  Respiratory: Negative for cough and shortness of breath.   Cardiovascular: Negative for chest pain.  Gastrointestinal: Negative for abdominal pain, constipation, diarrhea, nausea and vomiting.  Genitourinary: Negative for dysuria.  Musculoskeletal: Negative for joint pain.  Neurological: Negative for dizziness and headaches.   Exam: Physical Exam  HENT:  Nose: No mucosal edema.  Mouth/Throat: No oropharyngeal exudate or posterior oropharyngeal edema.  Eyes: Pupils are equal, round, and reactive to light. Conjunctivae, EOM and lids are normal.  Neck: No JVD present. Carotid bruit is not present. No edema present. No thyroid mass and no thyromegaly present.  Cardiovascular: S1 normal and S2 normal. Exam reveals no gallop.  No murmur heard. Pulses:      Dorsalis pedis pulses are 2+ on the right side and 2+ on the left side.  Respiratory: No respiratory distress. He has no  wheezes. He has no rhonchi. He has no rales.  GI: Soft. Bowel sounds are normal. There is no abdominal tenderness.  Musculoskeletal:     Right ankle: He exhibits no swelling.     Left ankle: He exhibits no swelling.  Lymphadenopathy:    He has no cervical adenopathy.  Neurological: He is alert.  Skin: Skin is warm. Nails show no clubbing.  Brownish discoloration fingers bilateral hands  Psychiatric: He has a normal mood and affect.      Data Reviewed: Basic Metabolic Panel: Recent Labs  Lab 02/12/19 2057 02/13/19 0441 02/15/19 2107 02/16/19 0558 02/17/19 0518 02/18/19 0431  NA 136 139 142 140  --  137  K 3.8 3.9 3.1* 3.0* 3.3* 3.2*  CL 111 114* 118* 119*  --  110  CO2 19* 18* 21* 19*  --  21*  GLUCOSE 98 87 100* 82  --  72  BUN '15 17 14 13  ' --  10  CREATININE 0.90 0.90 0.69 0.71  --  0.65  CALCIUM 7.1* 7.1* 7.1* 6.9*  --  7.0*  MG  --   --  1.9 2.0  --   --    Liver Function Tests: Recent Labs  Lab 02/12/19 2057 02/18/19 0431  AST 84* 81*  ALT 39 45*  ALKPHOS 246* 274*  BILITOT 0.7 0.5  PROT 6.8 6.1*  ALBUMIN 1.6* 1.4*   Recent Labs  Lab 02/12/19 2057  LIPASE 28  Recent Labs  Lab 02/12/19 2147  AMMONIA 14   CBC: Recent Labs  Lab 02/12/19 2057 02/13/19 0441 02/14/19 0549 02/15/19 0829 02/16/19 0558 02/18/19 0431  WBC 6.1 5.2 7.4 3.9* 3.3* 3.1*  NEUTROABS 5.1  --   --   --   --   --   HGB 8.1* 7.4* 8.3* 7.2* 7.4* 8.0*  HCT 25.5* 24.2* 27.1* 22.8* 23.8* 25.6*  MCV 86.4 88.0 90.3 87.7 88.5 87.1  PLT 79* 73* 95* 80* 79* 73*     Recent Results (from the past 240 hour(s))  Blood Culture (routine x 2)     Status: None   Collection Time: 02/12/19  8:57 PM   Specimen: BLOOD  Result Value Ref Range Status   Specimen Description BLOOD RIGHT ANTECUBITAL  Final   Special Requests   Final    BOTTLES DRAWN AEROBIC AND ANAEROBIC Blood Culture adequate volume   Culture   Final    NO GROWTH 5 DAYS Performed at [facility], [address], [place], [place] [address]    Report Status 02/17/2019 FINAL  Final  Blood Culture (routine x 2)     Status: None   Collection Time: 02/12/19  8:57 PM   Specimen: BLOOD  Result Value Ref Range Status   Specimen Description BLOOD BLOOD LEFT FOREARM  Final   Special Requests   Final    BOTTLES DRAWN AEROBIC AND ANAEROBIC Blood Culture adequate volume   Culture   Final    NO GROWTH 5 DAYS Performed at [facility], [address], [place], [place] [address]    Report Status 02/17/2019 FINAL  Final  SARS Coronavirus 2 (CEPHEID- Performed in [place] hospital lab), Hosp Order     Status: None   Collection Time: 02/12/19  8:57 PM   Specimen: Urine, Catheterized; Nasopharyngeal  Result Value Ref Range Status   SARS Coronavirus 2 NEGATIVE NEGATIVE Final    Comment: (NOTE) If result is NEGATIVE SARS-CoV-2 target nucleic acids are NOT DETECTED. The SARS-CoV-2 RNA is generally detectable in upper and lower  respiratory specimens during the acute phase of infection. The lowest  concentration of SARS-CoV-2 viral copies this assay can detect is 250  copies / mL. A negative result does not preclude SARS-CoV-2 infection  and should not be used as the sole basis for treatment or other  patient management decisions.  A negative result may occur with  improper specimen collection / handling, submission of specimen other  than nasopharyngeal swab, presence of viral mutation(s) within the  areas targeted by this assay, and inadequate number of viral copies  (<250 copies / mL). A negative result must be combined with clinical  observations, patient history, and epidemiological information. If result is POSITIVE SARS-CoV-2 target nucleic acids are DETECTED. The SARS-CoV-2 RNA is generally detectable in upper and lower  respiratory specimens dur ing the acute phase of infection.  Positive  results are indicative of active infection with SARS-CoV-2.  Clinical  correlation with patient  history and other diagnostic information is  necessary to determine patient infection status.  Positive results do  not rule out bacterial infection or co-infection with other viruses. If result is PRESUMPTIVE POSTIVE SARS-CoV-2 nucleic acids MAY BE PRESENT.   A presumptive positive result was obtained on the submitted specimen  and confirmed on repeat testing.  While 2019 novel coronavirus  (SARS-CoV-2) nucleic acids may be present in the submitted sample  additional confirmatory testing may be necessary for epidemiological  and /  or clinical management purposes  to differentiate between  SARS-CoV-2 and other Sarbecovirus currently known to infect humans.  If clinically indicated additional testing with an alternate test  methodology [id number]) is advised. The SARS-CoV-2 RNA is generally  detectable in upper and lower respiratory sp ecimens during the acute  phase of infection. The expected result is Negative. Fact Sheet for Patients:  StrictlyIdeas.no Fact Sheet for Healthcare Providers: BankingDealers.co.za This test is not yet approved or cleared by the [place] FDA and has been authorized for detection and/or diagnosis of SARS-CoV-2 by FDA under an Emergency Use Authorization (EUA).  This EUA will remain in effect (meaning this test can be used) for the duration of the COVID-19 declaration under Section 564(b)(1) of the Act, 21 U.S.C. section 360bbb-3(b)(1), unless the authorization is terminated or revoked sooner. Performed at [facility], [address] [place], [place] [address]       Scheduled Meds: . cyanocobalamin  1,000 mcg Intramuscular Daily  . feeding supplement (ENSURE ENLIVE)  1 Bottle Oral TID BM  . folic acid  1 mg Oral Daily  . mirtazapine  7.5 mg Oral QHS  . multivitamin with minerals  1 tablet Oral Daily  . pantoprazole  40 mg Oral Daily  . potassium chloride  20 mEq Oral BID  . thiamine  injection  100 mg Intravenous Daily  . vitamin C  500 mg Oral BID  . Vitamin D (Ergocalciferol)  50,000 Units Oral Q7 days   Continuous Infusions: . sodium chloride 50 mL/hr at 02/18/19 1108  . sodium chloride Stopped (02/16/19 0810)  . cefTRIAXone (ROCEPHIN)  IV Stopped (02/18/19 0453)    Assessment/Plan:  1. Sepsis secondary to pneumonia.  Zithromax completed.  Continue Rocephin for 7-day total. 2. Acute hypoxic respiratory failure.  Patient tapered off oxygen. 3. Subdural hematoma.  Seen by neurosurgery.  Continue physical therapy. 4. Pancytopenia.  Seen by oncology last visit.  Patient did not follow-up as outpatient.  We will continue to replace B12.  May end up needing a bone marrow biopsy as outpatient.  Seen by gastroenterology on the last hospital stay and they did not want to do any procedures. 5. Failure to thrive.  Increased Remeron. 6. Positive ANA and double-stranded DNA and lesions on fingers. 7. Vitamin D deficiency replace vitamin D 8. Vitamin C deficiency replace vitamin C 9. We will empirically give IV thiamine 10. Palliative care consultation.  Family meeting tomorrow to discuss goals of care.  Code Status:     Code Status Orders  (From admission, onward)         Start     Ordered   02/13/19 0208  Full code  Continuous     02/13/19 0207        Code Status History    Date Active Date Inactive Code Status Order ID Comments User Context   01/07/2019 0317 01/09/2019 1941 Full Code [id number]  Mayer Camel, NP ED   Advance Care Planning Activity     Family Communication: Spoke with brother on the phone Disposition Plan: To be determined  Time spent: 28 minutes.   Ryan Matthews [employer]

## 2019-02-18 NOTE — Progress Notes (Signed)
Pt refused all PO medications and Ensure tonight.  After multiple attempts, he just refused and went to sleep.

## 2019-02-18 NOTE — Progress Notes (Signed)
Physical Therapy Treatment Patient Details Name: Ryan Matthews MRN: [id number] DOB: 06-Sep-1950 Today's Date: 02/18/2019    History of Present Illness Dondi Aime is a 68yo hispanic white male who comes [facility] via EMS 7[facility]1 with fever, weakness, cough, and poor PO intake x2weeks per family, admitted with sepsis related to LLL PNA. Pt also found to have stable SDH neurosurgical consult appreciated. Pt presented with AMS not responding to questions in a meaningful way via interpreter.    PT Comments    Discussed with RN and MD prior to session.  HR and BP elevated but OK to participate in session.  Pt eating minimal lunch with tech upon arrival.  Mitts removed and attempted to monitor HR during session but finger pulse O2 not reading possibly due to cold fingers.  Session was limited to tolerance.  Sat EOB with max/mod a x 1.  Once sitting, he can only sit briefly without hands on support as he leans post and to right.  Unsafe to be left sitting unattended for any length of time.  He sits approx 4 minutes before being assisted back to supine with max a x 1.  He closes eyes and pulls blankets up promptly upon laying down.   Follow Up Recommendations  SNF     Equipment Recommendations  Wheelchair (measurements PT);Wheelchair cushion (measurements PT);Rolling walker with 5" wheels    Recommendations for Other Services       Precautions / Restrictions Precautions Precautions: Fall Precaution Comments: AMS c safety mittens donned Restrictions Weight Bearing Restrictions: No    Mobility  Bed Mobility Overal bed mobility: Needs Assistance Bed Mobility: Supine to Sit;Sit to Supine     Supine to sit: Mod assist;Max assist Sit to supine: Mod assist;Max assist   General bed mobility comments: unable to remain sittign for more than a few seconds without hands on assist.  Transfers                 General transfer comment: deferred as he is unable to sit without extensive  assist  Ambulation/Gait                 Stairs             Wheelchair Mobility    Modified Rankin (Stroke Patients Only)       Balance Overall balance assessment: Needs assistance Sitting-balance support: Feet supported Sitting balance-Leahy Scale: Poor Sitting balance - Comments: uanble to remain sitting for more than a second or two before LOB pos to right       Standing balance comment: deferred                            Cognition Arousal/Alertness: Lethargic Behavior During Therapy: Flat affect Overall Cognitive Status: Impaired/Different from baseline                                        Exercises      General Comments        Pertinent Vitals/Pain Pain Assessment: No/denies pain    Home Living                      Prior Function            PT Goals (current goals can now be found in the care plan section) Progress towards PT goals:  Not progressing toward goals - comment    Frequency    Min 2X/week      PT Plan Current plan remains appropriate    Co-evaluation              AM-PAC PT "6 Clicks" Mobility   Outcome Measure  Help needed turning from your back to your side while in a flat bed without using bedrails?: A Lot Help needed moving from lying on your back to sitting on the side of a flat bed without using bedrails?: A Lot Help needed moving to and from a bed to a chair (including a wheelchair)?: A Lot Help needed standing up from a chair using your arms (e.g., wheelchair or bedside chair)?: A Lot Help needed to walk in hospital room?: Total Help needed climbing 3-5 steps with a railing? : Total 6 Click Score: 10    End of Session   Activity Tolerance: Patient limited by fatigue;Patient limited by lethargy Patient left: in bed;with call bell/phone within reach;with bed alarm set Nurse Communication: Other (comment)       Time: 0981-1914: 1224-1234 PT Time Calculation (min) (ACUTE  ONLY): 10 min  Charges:  $Therapeutic Activity: 8-22 mins                     Danielle DessSarah Chalet Kerwin, PTA 02/18/19, 1:16 PM

## 2019-02-18 NOTE — Consult Note (Signed)
Consultation Note Date: 02/18/2019   Patient Name: Ryan Matthews  DOB: 11/05/1950  MRN: [id number]  Age / Sex: 68 y.o., male  PCP: Patient, No Pcp Per Referring Physician: Alford HighlandWieting, Richard, MD  Reason for Consultation: Establishing goals of care  HPI/Patient Profile: Ryan Matthews  is a 68 y.o. male with a known history of dementia, hypertension, and pancytopenia.  Patient lives with his brother who is his primary caregiver.  He was brought to the emergency room for evaluation of confusion with altered mental status as well as a persistent hacking cough and fever.  Clinical Assessment and Goals of Care: Patient is resting in bed on his side. He opens his eyes to look at me but does not speak. He is very thin and frail.  Called to speak with his brother. He has lived with his brother for 8-10 years. He states his brother began to decline around 8 months ago. He began losing weight. He would only eat vegetables and fruits. He began to sleep more and blamed it on starting Atenolol for HTN. Around 2 months ago, he stopped working and stayed at home nad slept. H stopped eating. His brother states he tried to get him to drink Ensure and eat soup, but he did not really want it. He states eventually, Mr. Sharen HonesGutierrez would stop talking to him, or state "leave me alone, if I'm going to die, let me die."   He states the patient has a wife in his home country, but he does not know any contact information for her. He has a girlfriend that he talks to on the phone, and has only seen 1 time. His brother states "he said he thought he had 8-10 children". The children are in multiple countries. The brother states he knows of 4, because he talks to them regularly.  We discussed his diagnosis, prognosis, GOC, EOL wishes disposition and options.  A detailed discussion was had today regarding advanced directives.  Concepts specific  to code status, artifical feeding and hydration, and rehospitalization were discussed.  The difference between an aggressive medical intervention path and a comfort care path was discussed.  Values and goals of care important to patient and family were attempted to be elicited.  Discussed limitations of medical interventions to prolong quality of life in some situations and discussed the concept of human mortality.  He states he feels his brother just wants to die. He states " you can do what you can, but you can't help someone that does not want to help themselves."   Plans for a bedside meeting tomorrow at 12:00 with children on the phone.     SUMMARY OF RECOMMENDATIONS   Brother to meet at bedside tomorrow at 12:00 with children on the phone.   Prognosis:   < 2 weeks Without eating and drinking.   Discharge Planning: To Be Determined      Primary Diagnoses: Present on Admission: . LLL pneumonia (HCC) . Depression   I have reviewed the medical record, interviewed the patient and family,  and examined the patient. The following aspects are pertinent.  Past Medical History:  Diagnosis Date  . Dementia ([place])   . Hypertension   . Other pancytopenia ([place]) 01/09/2019   Social History   Socioeconomic History  . Marital status: Single    Spouse name: Not on file  . Number of children: Not on file  . Years of education: Not on file  . Highest education level: Not on file  Occupational History  . Not on file  Social Needs  . Financial resource strain: Not on file  . Food insecurity    Worry: Not on file    Inability: Not on file  . Transportation needs    Medical: Not on file    Non-medical: Not on file  Tobacco Use  . Smoking status: Not on file  Substance and Sexual Activity  . Alcohol use: Not Currently  . Drug use: Not Currently  . Sexual activity: Not Currently  Lifestyle  . Physical activity    Days per week: Not on file    Minutes per session: Not on file  .  Stress: Not on file  Relationships  . Social Herbalist on phone: Not on file    Gets together: Not on file    Attends religious service: Not on file    Active member of club or organization: Not on file    Attends meetings of clubs or organizations: Not on file    Relationship status: Not on file  Other Topics Concern  . Not on file  Social History Narrative  . Not on file   No family history on file. Scheduled Meds: . cyanocobalamin  1,000 mcg Intramuscular Daily  . feeding supplement (ENSURE ENLIVE)  1 Bottle Oral TID BM  . folic acid  1 mg Oral Daily  . mirtazapine  7.5 mg Oral QHS  . multivitamin with minerals  1 tablet Oral Daily  . pantoprazole  40 mg Oral Daily  . thiamine injection  100 mg Intravenous Daily  . vitamin C  500 mg Oral BID  . Vitamin D (Ergocalciferol)  50,000 Units Oral Q7 days   Continuous Infusions: . sodium chloride 50 mL/hr at 02/18/19 1108  . sodium chloride Stopped (02/16/19 0810)  . cefTRIAXone (ROCEPHIN)  IV Stopped (02/18/19 0453)   PRN Meds:.sodium chloride, bisacodyl, guaiFENesin, haloperidol lactate, LORazepam **OR** LORazepam, ondansetron (ZOFRAN) IV, senna Medications Prior to Admission:  Prior to Admission medications   Medication Sig Start Date End Date Taking? Authorizing Provider  omeprazole (PRILOSEC) 20 MG capsule Take 1 capsule (20 mg total) by mouth daily. 01/09/19 01/09/20 Yes Wieting, Richard, MD  vitamin B-12 (CYANOCOBALAMIN) 1000 MCG tablet Take 1 tablet (1,000 mcg total) by mouth daily. 01/09/19  Yes Wieting, Richard, MD  Vitamin D, Ergocalciferol, (DRISDOL) 1.25 MG (50000 UT) CAPS capsule Take 1 capsule (50,000 Units total) by mouth every 7 (seven) days. 01/09/19  Yes Wieting, Richard, MD  feeding supplement, ENSURE ENLIVE, (ENSURE ENLIVE) LIQD Take 237 mLs by mouth 3 (three) times daily between meals. 01/09/19   Loletha Grayer, MD   No Known Allergies Review of Systems  Unable to perform ROS   Physical Exam  Constitutional:      Comments: Thin and frail. Opens eyes to voice.   Pulmonary:     Effort: Pulmonary effort is normal.     Vital Signs: BP (!) 150/98 (BP Location: Left Arm)   Pulse (!) 122   Temp 97.7 F (36.5 C) (Oral)  Resp 17   Wt 53.5 kg   SpO2 99%   BMI 17.93 kg/m  Pain Scale: 0-10   Pain Score: 0-No pain   SpO2: SpO2: 99 % O2 Device:SpO2: 99 % O2 Flow Rate: .O2 Flow Rate (L/min): 3 L/min  IO: Intake/output summary:   Intake/Output Summary (Last 24 hours) at 02/18/2019 1237 Last data filed at 02/18/2019 1108 Gross per 24 hour  Intake 1418.86 ml  Output 1650 ml  Net -231.14 ml    LBM: Last BM Date: 02/16/19 Baseline Weight: Weight: 53.5 kg Most recent weight: Weight: 53.5 kg     Palliative Assessment/Data:     Time In: 11:40 Time Out: 12:50 Time Total: 70 min Greater than 50%  of this time was spent counseling and coordinating care related to the above assessment and plan.  Signed by: Morton Stallrystal Tayshawn Purnell, NP   Please contact Palliative Medicine Team phone at [phone] for questions and concerns.  For individual provider: See Loretha StaplerAmion

## 2019-02-18 NOTE — Progress Notes (Signed)
Potassium 3.2 this am. MD notified ([place]).

## 2019-02-19 ENCOUNTER — Inpatient Hospital Stay: Payer: Medicaid Other

## 2019-02-19 LAB — SEDIMENTATION RATE: Sed Rate: 106 mm/hr — ABNORMAL HIGH (ref 0–20)

## 2019-02-19 MED ORDER — ONDANSETRON 4 MG PO TBDP
4.0000 mg | ORAL_TABLET | Freq: Four times a day (QID) | ORAL | Status: DC | PRN
  Filled 2019-02-19: qty 1

## 2019-02-19 MED ORDER — MORPHINE SULFATE (PF) 2 MG/ML IV SOLN: 1.0000 mg | INTRAVENOUS | Status: DC | PRN

## 2019-02-19 MED ORDER — POLYVINYL ALCOHOL 1.4 % OP SOLN
1.0000 [drp] | Freq: Four times a day (QID) | OPHTHALMIC | Status: DC | PRN
  Filled 2019-02-19: qty 15

## 2019-02-19 MED ORDER — SODIUM CHLORIDE 0.9% FLUSH
3.0000 mL | Freq: Two times a day (BID) | INTRAVENOUS | Status: DC
  Administered 2019-02-19 – 2019-02-20 (×3): 3 mL via INTRAVENOUS

## 2019-02-19 MED ORDER — LORAZEPAM 1 MG PO TABS: 1.0000 mg | ORAL_TABLET | ORAL | Status: DC | PRN

## 2019-02-19 MED ORDER — LORAZEPAM 2 MG/ML PO CONC: 1.0000 mg | ORAL | Status: DC | PRN

## 2019-02-19 MED ORDER — SODIUM CHLORIDE 0.9 % IV SOLN: 250.0000 mL | INTRAVENOUS | Status: DC | PRN

## 2019-02-19 MED ORDER — MORPHINE SULFATE (PF) 2 MG/ML IV SOLN
1.0000 mg | INTRAVENOUS | Status: DC | PRN
  Administered 2019-02-19 – 2019-02-20 (×4): 1 mg via INTRAVENOUS
  Filled 2019-02-19 (×4): qty 1

## 2019-02-19 MED ORDER — LORAZEPAM 2 MG/ML IJ SOLN
0.5000 mg | INTRAMUSCULAR | Status: DC | PRN
  Administered 2019-02-19 – 2019-02-20 (×3): 0.5 mg via INTRAVENOUS
  Filled 2019-02-19 (×3): qty 1

## 2019-02-19 MED ORDER — ACETAMINOPHEN 325 MG PO TABS: 650.0000 mg | ORAL_TABLET | Freq: Four times a day (QID) | ORAL | Status: DC | PRN

## 2019-02-19 MED ORDER — ONDANSETRON HCL 4 MG/2ML IJ SOLN: 4.0000 mg | Freq: Four times a day (QID) | INTRAMUSCULAR | Status: DC | PRN

## 2019-02-19 MED ORDER — SODIUM CHLORIDE 0.9% FLUSH: 3.0000 mL | INTRAVENOUS | Status: DC | PRN

## 2019-02-19 MED ORDER — GLYCOPYRROLATE 0.2 MG/ML IJ SOLN: 0.2000 mg | INTRAMUSCULAR | Status: DC | PRN

## 2019-02-19 MED ORDER — ACETAMINOPHEN 650 MG RE SUPP: 650.0000 mg | Freq: Four times a day (QID) | RECTAL | Status: DC | PRN

## 2019-02-19 MED ORDER — POTASSIUM CHLORIDE CRYS ER 20 MEQ PO TBCR
20.0000 meq | EXTENDED_RELEASE_TABLET | Freq: Two times a day (BID) | ORAL | Status: DC
  Administered 2019-02-19: 20 meq via ORAL
  Filled 2019-02-19: qty 1

## 2019-02-19 MED ORDER — LORAZEPAM 2 MG/ML IJ SOLN: 1.0000 mg | INTRAMUSCULAR | Status: DC | PRN

## 2019-02-19 MED ORDER — BIOTENE DRY MOUTH MT LIQD: 15.0000 mL | OROMUCOSAL | Status: DC | PRN

## 2019-02-19 MED ORDER — GLYCOPYRROLATE 0.2 MG/ML IJ SOLN
0.2000 mg | INTRAMUSCULAR | Status: DC | PRN
  Filled 2019-02-19: qty 1

## 2019-02-19 MED ORDER — GLYCOPYRROLATE 1 MG PO TABS: 1.0000 mg | ORAL_TABLET | ORAL | Status: DC | PRN

## 2019-02-19 NOTE — Progress Notes (Signed)
Nutrition Brief Follow-Up Note  Chart reviewed. Patient now transitioning to comfort care.   No further nutrition interventions warranted at this time.  Please re-consult RD as needed.   Fadil Macmaster, MS, RD, LDN Office: [phone] Pager: [phone] After Hours/Weekend Pager: [phone]    

## 2019-02-19 NOTE — Progress Notes (Addendum)
Daily Progress Note   Patient Name: Ryan Matthews       Date: 02/19/2019 DOB: [birth year]  Age: 68 y.o. MRN#: [id number] Attending Physician: Otila Back, MD Primary Care Physician: Patient, No Pcp Per Admit Date: 02/12/2019  Reason for Consultation/Follow-up: Establishing goals of care  Subjective: Patient is resting in bed. His brother and priest are at bedside. Brother and priest speak [place] and [place]. Four of the patient's children who speak with him and the brother are on the phone with the brother as next of kin as they are not able to contact the estranged wife who lives in his home country. [place] Interpretor at bedside to interpret.   We discussed his diagnosis, prognosis, GOC, EOL wishes disposition and options.  A detailed discussion was had today regarding advanced directives.  Concepts specific to code status, artifical feeding and hydration, IV antibiotics and rehospitalization were discussed.  The difference between an aggressive medical intervention path and a comfort care path was discussed.  Values and goals of care important to patient and family were attempted to be elicited.  Discussed limitations of medical interventions to prolong quality of life in some situations and discussed the concept of human mortality.  The patient initially states (through interpretation) he wants to try to eat and take his medications as his children ask him to try to get better to return to his home country. His brother discusses the past 6 months of not eating and drinking, and decline. We discussed independence and QOL. Patient states he wants the tubes out and to be left alone. He states he wants to rest and go to heaven.   I completed a MOST form today with the patient and family. The brother  signed as the patient was too weak, and ws speaking to family members as he is nearing end of life.  The signed original was placed in the chart. A photocopy was placed in the chart to be scanned into EMR. The patient outlined their wishes for the following treatment decisions:  Cardiopulmonary Resuscitation: Do Not Attempt Resuscitation (DNR/No CPR)  Medical Interventions: Comfort Measures: Keep clean, warm, and dry. Use medication by any route, positioning, wound care, and other measures to relieve pain and suffering. Use oxygen, suction and manual treatment of airway obstruction as needed for comfort. Do not transfer to  the hospital unless comfort needs cannot be met in current location.  Antibiotics: No antibiotics (use other measures to relieve symptoms)  IV Fluids: No IV fluids (provide other measures to ensure comfort)  Feeding Tube: No feeding tube      Length of Stay: 6  Current Medications: Scheduled Meds:  . cyanocobalamin  1,000 mcg Intramuscular Daily  . feeding supplement (ENSURE ENLIVE)  1 Bottle Oral TID BM  . folic acid  1 mg Oral Daily  . mirtazapine  15 mg Oral QHS  . multivitamin with minerals  1 tablet Oral Daily  . pantoprazole  40 mg Oral Daily  . potassium chloride  20 mEq Oral BID  . thiamine injection  100 mg Intravenous Daily  . vitamin C  500 mg Oral BID  . Vitamin D (Ergocalciferol)  50,000 Units Oral Q7 days    Continuous Infusions: . sodium chloride 50 mL/hr at 02/19/19 1211  . sodium chloride Stopped (02/16/19 0810)    PRN Meds: sodium chloride, bisacodyl, guaiFENesin, haloperidol lactate, LORazepam **OR** LORazepam, metoprolol tartrate, ondansetron (ZOFRAN) IV, senna  Physical Exam Constitutional:      Comments: Very thin and frail.   Pulmonary:     Effort: Pulmonary effort is normal.  Neurological:     Mental Status: He is alert.             Vital Signs: BP (!) 130/92 (BP Location: Right Arm)   Pulse (!) 109   Temp 98.2 F (36.8 C)  (Oral)   Resp 17   Wt 53.5 kg   SpO2 99%   BMI 17.93 kg/m  SpO2: SpO2: 99 % O2 Device: O2 Device: Room Air O2 Flow Rate: O2 Flow Rate (L/min): 3 L/min  Intake/output summary:   Intake/Output Summary (Last 24 hours) at 02/19/2019 1331 Last data filed at 02/19/2019 1211 Gross per 24 hour  Intake 1277.54 ml  Output 1150 ml  Net 127.54 ml   LBM: Last BM Date: 02/18/19 Baseline Weight: Weight: 53.5 kg Most recent weight: Weight: 53.5 kg       Palliative Assessment/Data: 10%      Patient Active Problem List   Diagnosis Date Noted  . Depression 02/15/2019  . LLL pneumonia ([place]) 02/13/2019  . Other pancytopenia ([place]) 01/09/2019  . Vitamin B12 deficiency 01/09/2019  . Altered mental status 01/07/2019    Palliative Care Assessment & Plan    Recommendations/Plan:  Hospice facility.   Morphine for pain or dyspnea Ativan for anxiety Haldol already in place for agitation Robinul for excessive secretions.  Goals of Care and Additional Recommendations:  Limitations on Scope of Treatment: Full Comfort Care  Code Status:    Code Status Orders  (From admission, onward)         Start     Ordered   02/13/19 0208  Full code  Continuous     02/13/19 0207        Code Status History    Date Active Date Inactive Code Status Order ID Comments User Context   01/07/2019 0317 01/09/2019 1941 Full Code [id number]  Mayer Camel, NP ED   Advance Care Planning Activity       Prognosis:   < 2 weeks Patient has not been eating and drinking for months, and is refusing to eat, drink, and take meds here. PNA.   Discharge Planning:  Hospice facility  Care plan was discussed with CM.  Thank you for allowing the Palliative Medicine Team to assist in the care of this  patient.   Time In: 12:00 Time Out: 2:00 Total Time 2 hours Prolonged Time Billed  yes      Greater than 50%  of this time was spent counseling and coordinating care related to the above assessment and plan.   Asencion Gowda, NP  Please contact Palliative Medicine Team phone at [phone] for questions and concerns.

## 2019-02-19 NOTE — TOC Progression Note (Signed)
Transition of Care [facility]) - Progression Note    Patient Details  Name: Ryan Matthews MRN: [id number] Date of Birth: 09/13/1950  [facility]) CM/SW Contact  Shelbie Hutching, RN Phone Number: 02/19/2019, 2:12 PM  Clinical Narrative:    Palliative met with family at the bedside today and via phone.  Family and patient have decided on comfort measures and would like the hospice home.  Hospice home referral made to [place] with [facility].    Expected Discharge Plan: [place] Barriers to Discharge: Continued Medical Work up  Expected Discharge Plan and Services Expected Discharge Plan: [place]   Discharge Planning Services: CM Consult   Living arrangements for the past 2 months: Single Family Home                                       Social Determinants of Health (SDOH) Interventions    Readmission Risk Interventions No flowsheet data found.

## 2019-02-19 NOTE — Progress Notes (Signed)
[place] at [place] NAME: Ryan Matthews    MR#:  [id number]  DATE OF BIRTH:  May 20, 1951  SUBJECTIVE:  CHIEF COMPLAINT:   Chief Complaint  Patient presents with   Code Sepsis   Nursing staff reported patient having decreased p.o. intake.  Currently on CIWA protocol.  Palliative care meeting with family today to address goals of care.  REVIEW OF SYSTEMS:  Review of Systems  Constitutional: Negative for chills and fever.  HENT: Negative for hearing loss and tinnitus.   Eyes: Negative for blurred vision and double vision.  Respiratory: Negative for cough and shortness of breath.   Cardiovascular: Negative for chest pain and palpitations.  Gastrointestinal: Negative for heartburn and nausea.  Genitourinary: Negative for dysuria and urgency.  Musculoskeletal: Negative for myalgias and neck pain.  Skin: Negative for itching and rash.  Neurological: Negative for dizziness and headaches.  Psychiatric/Behavioral: Negative for depression and hallucinations.    DRUG ALLERGIES:  No Known Allergies VITALS:  Blood pressure (!) 130/92, pulse (!) 109, temperature 98.2 F (36.8 C), temperature source Oral, resp. rate 17, weight 53.5 kg, SpO2 99 %. PHYSICAL EXAMINATION:   Physical Exam  Constitutional: He is oriented to person, place, and time. He appears well-developed.  HENT:  Head: Normocephalic and atraumatic.  Right Ear: External ear normal.  Eyes: Pupils are equal, round, and reactive to light. Conjunctivae are normal. Right eye exhibits no discharge.  Neck: Normal range of motion. Neck supple. No thyromegaly present.  Cardiovascular: Normal rate, regular rhythm and normal heart sounds.  Respiratory: Effort normal. He has no wheezes.  GI: Soft. Bowel sounds are normal. He exhibits no distension.  Musculoskeletal: Normal range of motion.        General: No edema.     Comments: Brownish discoloration fingers bilateral hands     Neurological: He is alert and oriented to person, place, and time.  Skin: Skin is warm. He is not diaphoretic. No erythema.  Psychiatric: He has a normal mood and affect. His behavior is normal.     LABORATORY PANEL:  Male CBC Recent Labs  Lab 02/18/19 0431  WBC 3.1*  HGB 8.0*  HCT 25.6*  PLT 73*   ------------------------------------------------------------------------------------------------------------------ Chemistries  Recent Labs  Lab 02/16/19 0558  02/18/19 0431  NA 140  --  137  K 3.0*   < > 3.2*  CL 119*  --  110  CO2 19*  --  21*  GLUCOSE 82  --  72  BUN 13  --  10  CREATININE 0.71  --  0.65  CALCIUM 6.9*  --  7.0*  MG 2.0  --   --   AST  --   --  81*  ALT  --   --  45*  ALKPHOS  --   --  274*  BILITOT  --   --  0.5   < > = values in this interval not displayed.   RADIOLOGY:  Ct Head Wo Contrast  Result Date: 02/19/2019 CLINICAL DATA:  68 year old male with inter hemispheric subdural hematoma earlier this month. EXAM: CT HEAD WITHOUT CONTRAST TECHNIQUE: Contiguous axial images were obtained from the base of the skull through the vertex without intravenous contrast. COMPARISON:  02/13/2019 and earlier. FINDINGS: Brain: Small volume para falcine subdural hematoma has partially regressed (series 3, image 19 today versus series 3, image 20 previously), bowel 3 millimeters thickness now. Mild extension to the left tentorium as before (series 6, image  44). No associated intracranial mass effect. No new intracranial hemorrhage identified. Stable gray-white matter differentiation throughout the brain. No midline shift, ventriculomegaly, mass effect, evidence of mass lesion, or evidence of cortically based acute infarction. Vascular: Calcified atherosclerosis at the skull base. Skull: Stable and intact. Sinuses/Orbits: Visualized paranasal sinuses and mastoids are stable and well pneumatized. Other: Visualized orbits and scalp soft tissues are within normal limits.  IMPRESSION: 1. Small volume para falcine subdural hematoma has slightly decreased. 2. No associated intracranial mass effect, and no new acute intracranial abnormality. Electronically Signed   By: Genevie Ann M.D.   On: 02/19/2019 11:36   ASSESSMENT AND PLAN:   1. Sepsis secondary to pneumonia.  Zithromax and Rocephin completed.  2. Acute hypoxic respiratory failure.  Patient tapered off oxygen. Subdural hematoma.  Seen by neurosurgery.  Repeat CT head done today revealed,Small volume para falcine subdural hematoma has slightly decreased. No associated intracranial mass effect, and no new acute. Continue physical therapy.  3. Pancytopenia.  Seen by oncology last visit.  Patient did not follow-up as outpatient.  We will continue to replace B12.  Ryan end up needing a bone marrow biopsy as outpatient.  Seen by gastroenterology on the last hospital stay and they did not want to do any procedures.   Patient seen by rheumatologist for evaluation of pancytopenia.  Appreciate input.  Cannot rule out myelodysplasia or other immune related pancytopenia especially with evidence of cyanosis and splinter hemorrhages in the hands.  Could be part of a systemic inflammatory disease like dermatomyositis or vasculitis.  Rheumatologist ordered labs including  C3, C4, CPK, sed rate, CRP, lupus anticoagulant and anticardiolipin antibodies ,Urine protein to creatinine ratio. A complete work-up would include bone marrow, echocardiogram, nephrology opinion regarding  hematuria and proteinuria ,and neurology opinion regarding encephalopathy and possible LP/EEG/MRI3 Palliative care meeting with patient and family today to discuss goals of care.  If family wishes to continue further work-up, will request for the above as recommended by rheumatology.  3. Failure to thrive.  Increased Remeron. 3. Positive ANA and double-stranded DNA during last admission and lesions on fingers. 4. Vitamin D deficiency;  replace vitamin D 5. Vitamin  C deficiency;  replace vitamin C 6. We will empirically give IV thiamine  7. Palliative care consultation.  Family meeting today to discuss goals of care.  DVT prophylaxis; SCDs Avoiding heparin products due to thrombocytopenia.  All the records are reviewed and case discussed with Care Management/Social Worker. Management plans discussed with the patient, family and they are in agreement.  CODE STATUS: Full Code  TOTAL TIME TAKING CARE OF THIS PATIENT: 38 minutes.   More than 50% of the time was spent in counseling/coordination of care: YES  POSSIBLE D/C IN 2 DAYS, DEPENDING ON CLINICAL CONDITION.   Arella Blinder M.D on 02/19/2019 at 12:55 PM  Between 7am to 6pm - Pager - [phone]  After 6pm go to [email or web address] - Proofreader  [facility]  Office  [phone]  CC: Primary care physician; Patient, No Pcp Per  Note: This dictation was prepared with Dragon dictation along with smaller phrase technology. Any transcriptional errors that result from this process are unintentional.

## 2019-02-20 LAB — LUPUS ANTICOAGULANT PANEL
DRVVT: 35 s (ref 0.0–47.0)
PTT Lupus Anticoagulant: 36.7 s (ref 0.0–51.9)

## 2019-02-20 LAB — C4 COMPLEMENT: Complement C4, Body Fluid: 6 mg/dL — ABNORMAL LOW (ref 14–44)

## 2019-02-20 LAB — C3 COMPLEMENT: C3 Complement: 22 mg/dL — ABNORMAL LOW (ref 82–167)

## 2019-02-20 NOTE — Progress Notes (Signed)
Referral information sent to the referral intake office.  Flo Shanks, RN will be back today and work this patient up for inpatient hospice and get approval from Dr. Gilford Rile.  Doreatha Lew [facility] [phone]

## 2019-02-20 NOTE — TOC Transition Note (Signed)
Transition of Care [facility]) - CM/SW Discharge Note   Patient Details  Name: Ryan Matthews MRN: [id number] Date of Birth: 04-13-1951  Transition of Care [facility]) CM/SW Contact:  Shelbie Hutching, RN Phone Number: 02/20/2019, 2:02 PM   Clinical Narrative:    Patient will discharge to the hospice home in [place] today.  [facility] will transport.     Final next level of care: [place] Barriers to Discharge: Barriers Resolved   Patient Goals and CMS Choice Patient states their goals for this hospitalization and ongoing recovery are:: Patient's brother does not want him discharged until the patient can at least eat by himself      Discharge Placement              Patient [facility] bed at: [facility]) Patient to be transferred to facility by: [facility] Name of family member notified: Kirtland Bouchard- pts brother Patient and family notified of of transfer: 02/20/19  Discharge Plan and Services   Discharge Planning Services: CM Consult                                 Social Determinants of Health (SDOH) Interventions     Readmission Risk Interventions No flowsheet data found.

## 2019-02-20 NOTE — Progress Notes (Signed)
I missed call from Doran Clay today around [phone].  Checked  the voicemail this afternoon and received a referral for [facility].  We do not have a bed to offer today, but may likely have a bed tomorrow.  Information will be faxed to the referral intake office tomorrow morning. Thank you for allowing participation in this patient's care.  Doreatha Lew, RN Clinical Nurse Liaison [facility] [phone]

## 2019-02-20 NOTE — Progress Notes (Signed)
Daily Progress Note   Patient Name: Lubertha SayresSergio Sforza       Date: 02/20/2019 DOB: 02/20/1951  Age: 68 y.o. MRN#: [id number] Attending Physician: Jama Flavorsjie, Jude, MD Primary Care Physician: Patient, No Pcp Per Admit Date: 02/12/2019  Reason for Consultation/Follow-up: Terminal Care  Subjective: Patient is resting in bed with eyes closed. No signs of distress noted. Waiting for hospice facility bed. No changes to medication regimen.   Length of Stay: 7  Current Medications: Scheduled Meds:  . feeding supplement (ENSURE ENLIVE)  1 Bottle Oral TID BM  . mirtazapine  15 mg Oral QHS  . sodium chloride flush  3 mL Intravenous Q12H    Continuous Infusions: . sodium chloride      PRN Meds: sodium chloride, acetaminophen **OR** acetaminophen, antiseptic oral rinse, bisacodyl, [DISCONTINUED] glycopyrrolate **OR** [DISCONTINUED] glycopyrrolate **OR** glycopyrrolate, guaiFENesin, haloperidol lactate, [DISCONTINUED] LORazepam **OR** [DISCONTINUED] LORazepam **OR** LORazepam, morphine injection, ondansetron **OR** ondansetron (ZOFRAN) IV, polyvinyl alcohol, senna, sodium chloride flush  Physical Exam Constitutional:      Comments: Resting with eyes closed.   Pulmonary:     Comments: Even and unlabored respirations.             Vital Signs: BP (!) 146/105 (BP Location: Right Arm)   Pulse (!) 105   Temp (!) 96.3 F (35.7 C) (Axillary)   Resp 17   Wt 53.5 kg   SpO2 100%   BMI 17.93 kg/m  SpO2: SpO2: 100 % O2 Device: O2 Device: Room Air O2 Flow Rate: O2 Flow Rate (L/min): 3 L/min  Intake/output summary:   Intake/Output Summary (Last 24 hours) at 02/20/2019 1122 Last data filed at 02/20/2019 0843 Gross per 24 hour  Intake 337.41 ml  Output 500 ml  Net -162.59 ml   LBM: Last BM Date: 02/19/19  Baseline Weight: Weight: 53.5 kg Most recent weight: Weight: 53.5 kg       Palliative Assessment/Data: 10%      Patient Active Problem List   Diagnosis Date Noted  . Depression 02/15/2019  . LLL pneumonia ([facility]) 02/13/2019  . Other pancytopenia ([facility]) 01/09/2019  . Vitamin B12 deficiency 01/09/2019  . Altered mental status 01/07/2019    Palliative Care Assessment & Plan     Recommendations/Plan:  Hospice facility.    Code Status:    Code Status Orders  (From  admission, onward)         Start     Ordered   02/19/19 1415  Do not attempt resuscitation (DNR)  Continuous    Question Answer Comment  In the event of cardiac or respiratory ARREST Do not call a "code blue"   In the event of cardiac or respiratory ARREST Do not perform Intubation, CPR, defibrillation or ACLS   In the event of cardiac or respiratory ARREST Use medication by any route, position, wound care, and other measures to relive pain and suffering. May use oxygen, suction and manual treatment of airway obstruction as needed for comfort.   Comments MOST form in chart.      02/19/19 1416        Code Status History    Date Active Date Inactive Code Status Order ID Comments User Context   02/13/2019 0207 02/19/2019 1416 Full Code [id number]  Mayer Camel, NP ED   01/07/2019 0317 01/09/2019 1941 Full Code [id number]  Mayer Camel, NP ED   Advance Care Planning Activity       Prognosis:  < 2 weeks.   Discharge Planning:  Hospice facility   Thank you for allowing the Palliative Medicine Team to assist in the care of this patient.   Total Time 15 min Prolonged Time Billed  no      Greater than 50%  of this time was spent counseling and coordinating care related to the above assessment and plan.  Asencion Gowda, NP  Please contact Palliative Medicine Team phone at [phone] for questions and concerns.

## 2019-02-20 NOTE — Progress Notes (Signed)
Writer met in the family room with patient's brother Kirtland Bouchard to initiate education regarding hospice services, philosophy and team approach to care with understanding voiced. Consents signed and faxed to referral, discharge summary and update notes faxed to referral. Report called to the hospice home. EMS notified for transport. Signed DNR in place in the discharge packet. Hospital care team and family updated. Flo Shanks BSN, RN, [facility] [phone]

## 2019-02-20 NOTE — Progress Notes (Signed)
Pt being discharge via EMS to hospice home, pt resting comfortably, being transported at this time

## 2019-02-20 NOTE — Discharge Summary (Signed)
[facility] [place] NAME: Ryan Matthews    MR#:  [id number]  DATE OF BIRTH:  November 09, 1950  DATE OF ADMISSION:  02/12/2019   ADMITTING PHYSICIAN: Christel Mormon, MD  DATE OF DISCHARGE: 02/20/2019  PRIMARY CARE PHYSICIAN: Patient, No Pcp Per   ADMISSION DIAGNOSIS:  Subdural hematoma ([place]) [S06.5X9A] HCAP (healthcare-associated pneumonia) [J18.9] Sepsis, due to unspecified organism, unspecified whether acute organ dysfunction present ([place]) [A41.9] DISCHARGE DIAGNOSIS:  Active Problems:   LLL pneumonia ([place])   Depression  SECONDARY DIAGNOSIS:   Past Medical History:  Diagnosis Date  . Dementia ([place])   . Hypertension   . Other pancytopenia ([place]) 01/09/2019   HOSPITAL COURSE:  Chief complaint; altered mental status  History of presenting complaint; Ryan Matthews  is a 68 y.o. male with a known history of dementia, hypertension, and pancytopenia.  Patient lives with his brother who is his primary caregiver.  He was brought to the emergency room for evaluation of confusion with altered mental status as well as a persistent hacking cough and fever.  Patient was noted by his brother to have decreased p.o. intake over the last 2 weeks with increasing confusion and weakness.  EMS reported patient being found soaked with urine and a large bowel movement.   Lactic acid was 1.0.  WBC 6.1 with platelet count 79.  Hemoglobin 8.1 with hematocrit 25.5.  INR is 1.3.  Rapid COVID testing is negative.  Urinalysis is negative.  CT brain demonstrates small parafalcine subdural hematoma.  Dr. Izora Ribas was consulted by the ED physician with orders received to keep platelet count above 75,000 and INR less than 1.3.  Chest x-ray demonstrated left lower lobe infiltrate.  He has been admitted to the hospitalist service for sepsis with left lower lobe pneumonia.   Hospital course; 1. Sepsis secondary to pneumonia. Zithromax and Rocephin completed.  2. Acute  hypoxic respiratory failure. Patient tapered off oxygen. Subdural hematoma. Seen by neurosurgery.  Repeat CT head done revealed,Small volume para falcine subdural hematoma has slightly decreased. No associated intracranial mass effect, and no new acute.  Being discharged to hospice house  3. Pancytopenia. Seen by oncology last visit. Patient did not follow-up as outpatient. We will continue to replace B12. Seen by gastroenterology on the last hospital stay and they did not want to do any procedures.   Patient seen by rheumatologist for evaluation of pancytopenia.  Appreciate input.  Cannot rule out myelodysplasia or other immune related pancytopenia especially with evidence of cyanosis and splinter hemorrhages in the hands.  Could be part of a systemic inflammatory disease like dermatomyositis or vasculitis.  Rheumatologist ordered labs including C3, C4, CPK, sed rate, CRP, lupus anticoagulant and anticardiolipin antibodies ,Urine protein to creatinine ratio. A complete work-up would include bone marrow, echocardiogram, nephrology opinion regarding hematuria and proteinuria ,and neurology opinion regarding encephalopathy and possible LP/EEG/MRI3 Palliative care met discussed with patient and family and they have decided on having patient discharged to hospice house.  Normal antibiotics.  No IV fluid.  No feeding tube.  Focus on comfort care measures only going forward.  Plans for discharge to hospice house today since no bed is available.  3. Failure to thrive. To hospice house once bed available 3. Positive ANA and double-stranded DNA during last admission and lesions on fingers.  Possibly due to underlying vasculitis. 4. Vitamin D deficiency;  replaced vitamin D 5. Vitamin C deficiency;  replaced vitamin C  DISCHARGE CONDITIONS:  To hospice  house CONSULTS OBTAINED:  Treatment Team:  Emmaline Kluver., MD DRUG ALLERGIES:  No Known Allergies DISCHARGE MEDICATIONS:   Allergies as  of 02/20/2019   No Known Allergies     Medication List    STOP taking these medications   omeprazole 20 MG capsule Commonly known as: PriLOSEC   vitamin B-12 1000 MCG tablet Commonly known as: CYANOCOBALAMIN   Vitamin D (Ergocalciferol) 1.25 MG (50000 UT) Caps capsule Commonly known as: DRISDOL     TAKE these medications   feeding supplement (ENSURE ENLIVE) Liqd Take 237 mLs by mouth 3 (three) times daily between meals.        DISCHARGE INSTRUCTIONS:   DIET:  Pleasure feeding as tolerated DISCHARGE CONDITION:  Critical ACTIVITY:  Bedrest OXYGEN:  Home Oxygen: No.  Oxygen Delivery: room air DISCHARGE LOCATION:  Hospice house  If you experience worsening of your admission symptoms, develop shortness of breath, life threatening emergency, suicidal or homicidal thoughts you must seek medical attention immediately by calling 911 or calling your MD immediately  if symptoms less severe.  You Must read complete instructions/literature along with all the possible adverse reactions/side effects for all the Medicines you take and that have been prescribed to you. Take any new Medicines after you have completely understood and accpet all the possible adverse reactions/side effects.   Please note  You were cared for by a hospitalist during your hospital stay. If you have any questions about your discharge medications or the care you received while you were in the hospital after you are discharged, you can call the unit and asked to speak with the hospitalist on call if the hospitalist that took care of you is not available. Once you are discharged, your primary care physician will handle any further medical issues. Please note that NO REFILLS for any discharge medications will be authorized once you are discharged, as it is imperative that you return to your primary care physician (or establish a relationship with a primary care physician if you do not have one) for your aftercare needs  so that they can reassess your need for medications and monitor your lab values.    On the day of Discharge:  VITAL SIGNS:  Blood pressure (!) 146/105, pulse (!) 105, temperature (!) 96.3 F (35.7 C), temperature source Axillary, resp. rate 17, weight 53.5 kg, SpO2 100 %. PHYSICAL EXAMINATION:   Physical Exam  Constitutional: No distress.  Thin  HENT:  Head: Normocephalic and atraumatic.  Right Ear: External ear normal.  Eyes: Pupils are equal, round, and reactive to light. Conjunctivae are normal. Right eye exhibits no discharge.  Neck: Normal range of motion. Neck supple. No thyromegaly present.  Cardiovascular: Normal rate, regular rhythm and normal heart sounds.  Respiratory: Effort normal. He has no wheezes.  GI: Soft. Bowel sounds are normal. He exhibits no distension.  Musculoskeletal: Normal range of motion.        General: No edema.     Comments: Brownish discoloration fingers bilateral hands   Neurological:  Patient very sleepy.  Full neuro exam not done at this time.  Skin: Skin is warm. No erythema.   DATA REVIEW:   CBC Recent Labs  Lab 02/18/19 0431  WBC 3.1*  HGB 8.0*  HCT 25.6*  PLT 73*    Chemistries  Recent Labs  Lab 02/16/19 0558  02/18/19 0431  NA 140  --  137  K 3.0*   < > 3.2*  CL 119*  --  110  CO2 19*  --  21*  GLUCOSE 82  --  72  BUN 13  --  10  CREATININE 0.71  --  0.65  CALCIUM 6.9*  --  7.0*  MG 2.0  --   --   AST  --   --  81*  ALT  --   --  45*  ALKPHOS  --   --  274*  BILITOT  --   --  0.5   < > = values in this interval not displayed.     Microbiology Results  Results for orders placed or performed during the hospital encounter of 02/12/19  Blood Culture (routine x 2)     Status: None   Collection Time: 02/12/19  8:57 PM   Specimen: BLOOD  Result Value Ref Range Status   Specimen Description BLOOD RIGHT ANTECUBITAL  Final   Special Requests   Final    BOTTLES DRAWN AEROBIC AND ANAEROBIC Blood Culture adequate volume    Culture   Final    NO GROWTH 5 DAYS Performed at [facility], [address] [place], [place] [address]    Report Status 02/17/2019 FINAL  Final  Blood Culture (routine x 2)     Status: None   Collection Time: 02/12/19  8:57 PM   Specimen: BLOOD  Result Value Ref Range Status   Specimen Description BLOOD BLOOD LEFT FOREARM  Final   Special Requests   Final    BOTTLES DRAWN AEROBIC AND ANAEROBIC Blood Culture adequate volume   Culture   Final    NO GROWTH 5 DAYS Performed at [facility], [address] [place], [place] [address]    Report Status 02/17/2019 FINAL  Final  SARS Coronavirus 2 (CEPHEID- Performed in [place] hospital lab), Hosp Order     Status: None   Collection Time: 02/12/19  8:57 PM   Specimen: Urine, Catheterized; Nasopharyngeal  Result Value Ref Range Status   SARS Coronavirus 2 NEGATIVE NEGATIVE Final    Comment: (NOTE) If result is NEGATIVE SARS-CoV-2 target nucleic acids are NOT DETECTED. The SARS-CoV-2 RNA is generally detectable in upper and lower  respiratory specimens during the acute phase of infection. The lowest  concentration of SARS-CoV-2 viral copies this assay can detect is 250  copies / mL. A negative result does not preclude SARS-CoV-2 infection  and should not be used as the sole basis for treatment or other  patient management decisions.  A negative result may occur with  improper specimen collection / handling, submission of specimen other  than nasopharyngeal swab, presence of viral mutation(s) within the  areas targeted by this assay, and inadequate number of viral copies  (<250 copies / mL). A negative result must be combined with clinical  observations, patient history, and epidemiological information. If result is POSITIVE SARS-CoV-2 target nucleic acids are DETECTED. The SARS-CoV-2 RNA is generally detectable in upper and lower  respiratory specimens dur ing the acute phase of infection.  Positive  results  are indicative of active infection with SARS-CoV-2.  Clinical  correlation with patient history and other diagnostic information is  necessary to determine patient infection status.  Positive results do  not rule out bacterial infection or co-infection with other viruses. If result is PRESUMPTIVE POSTIVE SARS-CoV-2 nucleic acids MAY BE PRESENT.   A presumptive positive result was obtained on the submitted specimen  and confirmed on repeat testing.  While 2019 novel coronavirus  (SARS-CoV-2) nucleic acids may be present in the submitted  sample  additional confirmatory testing may be necessary for epidemiological  and / or clinical management purposes  to differentiate between  SARS-CoV-2 and other Sarbecovirus currently known to infect humans.  If clinically indicated additional testing with an alternate test  methodology [phone]) is advised. The SARS-CoV-2 RNA is generally  detectable in upper and lower respiratory sp ecimens during the acute  phase of infection. The expected result is Negative. Fact Sheet for Patients:  StrictlyIdeas.no Fact Sheet for Healthcare Providers: BankingDealers.co.za This test is not yet approved or cleared by the [place] FDA and has been authorized for detection and/or diagnosis of SARS-CoV-2 by FDA under an Emergency Use Authorization (EUA).  This EUA will remain in effect (meaning this test can be used) for the duration of the COVID-19 declaration under Section 564(b)(1) of the Act, 21 U.S.C. section 360bbb-3(b)(1), unless the authorization is terminated or revoked sooner. Performed at [facility], [address], [place], [place] [address]     RADIOLOGY:  No results found.   Management plans discussed with the patient, family and they are in agreement.  CODE STATUS: DNR   TOTAL TIME TAKING CARE OF THIS PATIENT: 40 minutes.    Shauniece Kwan M.D on 02/20/2019 at 2:57 PM  Between 7am  to 6pm - Pager - [phone]  After 6pm go to [email or web address] - Proofreader  [facility]  Office  [phone]  CC: Primary care physician; Patient, No Pcp Per   Note: This dictation was prepared with Dragon dictation along with smaller phrase technology. Any transcriptional errors that result from this process are unintentional.

## 2019-02-20 NOTE — Progress Notes (Signed)
[facility] [place] NAME: Ryan Matthews    MR#:  [id number]  DATE OF BIRTH:  1951-03-10  SUBJECTIVE:  CHIEF COMPLAINT:   Chief Complaint  Patient presents with   Code Sepsis   No new complaints.  Palliative care discussed with patient and family yesterday.  They have decided on discharge to hospice house once bed available.  REVIEW OF SYSTEMS:  Review of Systems  Constitutional: Negative for chills and fever.  HENT: Negative for hearing loss and tinnitus.   Eyes: Negative for blurred vision and double vision.  Respiratory: Negative for cough and shortness of breath.   Cardiovascular: Negative for chest pain and palpitations.  Gastrointestinal: Negative for heartburn and nausea.  Genitourinary: Negative for dysuria and urgency.  Musculoskeletal: Negative for myalgias and neck pain.  Skin: Negative for itching and rash.  Neurological: Negative for dizziness and headaches.  Psychiatric/Behavioral: Negative for depression and hallucinations.    DRUG ALLERGIES:  No Known Allergies VITALS:  Blood pressure (!) 146/105, pulse (!) 105, temperature (!) 96.3 F (35.7 C), temperature source Axillary, resp. rate 17, weight 53.5 kg, SpO2 100 %. PHYSICAL EXAMINATION:   Physical Exam  Constitutional: No distress.  Thin  HENT:  Head: Normocephalic and atraumatic.  Right Ear: External ear normal.  Eyes: Pupils are equal, round, and reactive to light. Conjunctivae are normal. Right eye exhibits no discharge.  Neck: Normal range of motion. Neck supple. No thyromegaly present.  Cardiovascular: Normal rate, regular rhythm and normal heart sounds.  Respiratory: Effort normal. He has no wheezes.  GI: Soft. Bowel sounds are normal. He exhibits no distension.  Musculoskeletal: Normal range of motion.        General: No edema.     Comments: Brownish discoloration fingers bilateral hands   Neurological:  Patient very sleepy.  Full neuro exam  not done at this time.  Skin: Skin is warm. No erythema.     LABORATORY PANEL:  Male CBC Recent Labs  Lab 02/18/19 0431  WBC 3.1*  HGB 8.0*  HCT 25.6*  PLT 73*   ------------------------------------------------------------------------------------------------------------------ Chemistries  Recent Labs  Lab 02/16/19 0558  02/18/19 0431  NA 140  --  137  K 3.0*   < > 3.2*  CL 119*  --  110  CO2 19*  --  21*  GLUCOSE 82  --  72  BUN 13  --  10  CREATININE 0.71  --  0.65  CALCIUM 6.9*  --  7.0*  MG 2.0  --   --   AST  --   --  81*  ALT  --   --  45*  ALKPHOS  --   --  274*  BILITOT  --   --  0.5   < > = values in this interval not displayed.   RADIOLOGY:  No results found. ASSESSMENT AND PLAN:   1. Sepsis secondary to pneumonia.  Zithromax and Rocephin completed.  2. Acute hypoxic respiratory failure.  Patient tapered off oxygen. Subdural hematoma.  Seen by neurosurgery.  Repeat CT head done revealed,Small volume para falcine subdural hematoma has slightly decreased. No associated intracranial mass effect, and no new acute. Continue physical therapy.  3. Pancytopenia.  Seen by oncology last visit.  Patient did not follow-up as outpatient.  We will continue to replace B12.  Seen by gastroenterology on the last hospital stay and they did not want to do any procedures.   Patient seen by rheumatologist for  evaluation of pancytopenia.  Appreciate input.  Cannot rule out myelodysplasia or other immune related pancytopenia especially with evidence of cyanosis and splinter hemorrhages in the hands.  Could be part of a systemic inflammatory disease like dermatomyositis or vasculitis.  Rheumatologist ordered labs including  C3, C4, CPK, sed rate, CRP, lupus anticoagulant and anticardiolipin antibodies ,Urine protein to creatinine ratio. A complete work-up would include bone marrow, echocardiogram, nephrology opinion regarding  hematuria and proteinuria ,and neurology opinion regarding  encephalopathy and possible LP/EEG/MRI3 Palliative care met discussed with patient and family and they have decided on having patient discharged to hospice house.  Normal antibiotics.  No IV fluid.  No feeding tube.  Focus on comfort care measures only going forward.  Plans for discharge to hospice house once bed available  3. Failure to thrive.  Increased Remeron.  To hospice house once bed available 3. Positive ANA and double-stranded DNA during last admission and lesions on fingers. 4. Vitamin D deficiency;  replace vitamin D 5. Vitamin C deficiency;  replace vitamin C 6. We will empirically give IV thiamine   DVT prophylaxis; SCDs Avoiding heparin products due to recent thrombocytopenia.  All the records are reviewed and case discussed with Care Management/Social Worker. Management plans discussed with the patient, family and they are in agreement.  CODE STATUS: DNR  TOTAL TIME TAKING CARE OF THIS PATIENT: 27 minutes.   More than 50% of the time was spent in counseling/coordination of care: YES  POSSIBLE D/C IN 1DAY, DEPENDING ON CLINICAL CONDITION.   Ryan Matthews M.D on 02/20/2019 at 1:25 PM  Between 7am to 6pm - Pager - [phone]  After 6pm go to [email or web address] - Proofreader  [facility]  Office  [phone]  CC: Primary care physician; Patient, No Pcp Per  Note: This dictation was prepared with Dragon dictation along with smaller phrase technology. Any transcriptional errors that result from this process are unintentional.

## 2019-02-20 NOTE — Progress Notes (Signed)
Patient brother took patient's belongings home.  [name], BSN

## 2019-02-20 NOTE — Progress Notes (Signed)
Telephone cal placed to patient's brother Kirtland Bouchard to discuss hospice services and consents. Unable to leave a message on number listed, will continue to try to reach Mr. Bovenzi.

## 2019-02-21 LAB — CARDIOLIPIN ANTIBODIES, IGG, IGM, IGA
Anticardiolipin IgA: 9 APL U/mL (ref 0–11)
Anticardiolipin IgG: 13 GPL U/mL (ref 0–14)
Anticardiolipin IgM: 17 MPL U/mL — ABNORMAL HIGH (ref 0–12)

## 2019-02-24 ENCOUNTER — Other Ambulatory Visit: Payer: Self-pay | Admitting: Neurosurgery

## 2019-02-24 DIAGNOSIS — S065X9A Traumatic subdural hemorrhage with loss of consciousness of unspecified duration, initial encounter: Secondary | ICD-10-CM

## 2019-02-24 DIAGNOSIS — S065XAA Traumatic subdural hemorrhage with loss of consciousness status unknown, initial encounter: Secondary | ICD-10-CM

## 2019-03-15 DEATH — deceased

## 2019-03-19 ENCOUNTER — Encounter: Payer: Self-pay | Admitting: Gastroenterology

## 2019-12-06 IMAGING — CT CT HEAD WITHOUT CONTRAST
4 of 5 series · 15 of 47 positions shown, 17 images · non-contrast
Comparison: 02/13/2019 and earlier.

CLINICAL DATA: 67-year-old male with inter hemispheric subdural
hematoma earlier this month.

EXAM:
CT HEAD WITHOUT CONTRAST
TECHNIQUE: Contiguous axial images were obtained from the base of the skull
through the vertex without intravenous contrast.

[Series 3: head wo · axial · 0.44mm/px · z∈[-143,-48]mm · 5 of 29 slices shown, 7 images (1 of 2)]
[im 5/29  brain]
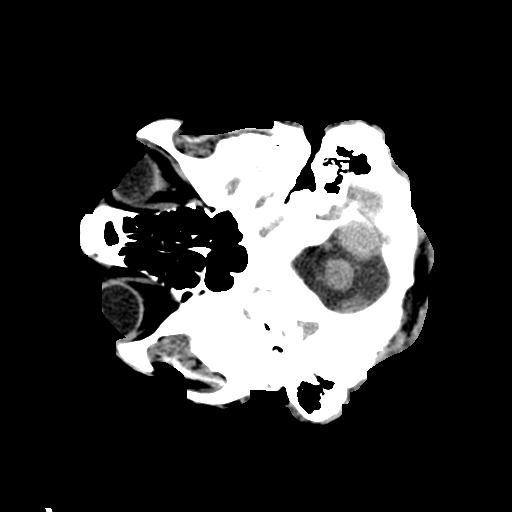
[im 5/29  bone]
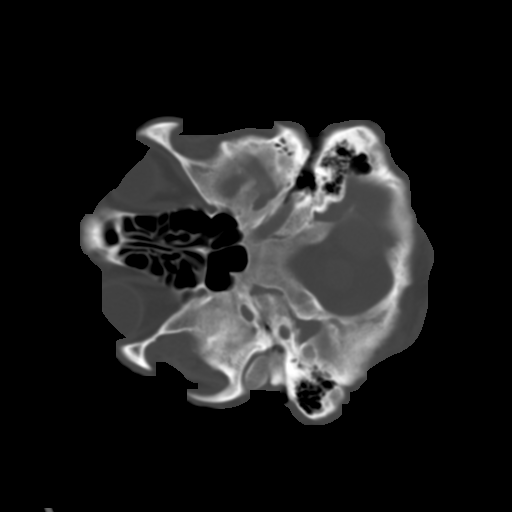
[im 10/29  brain]
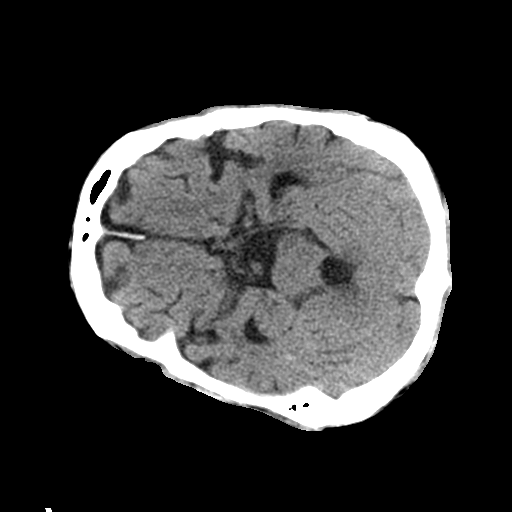
[im 15/29  brain]
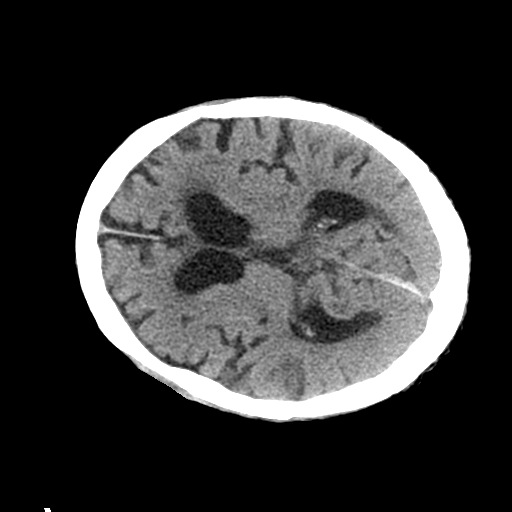
[im 19/29  brain]
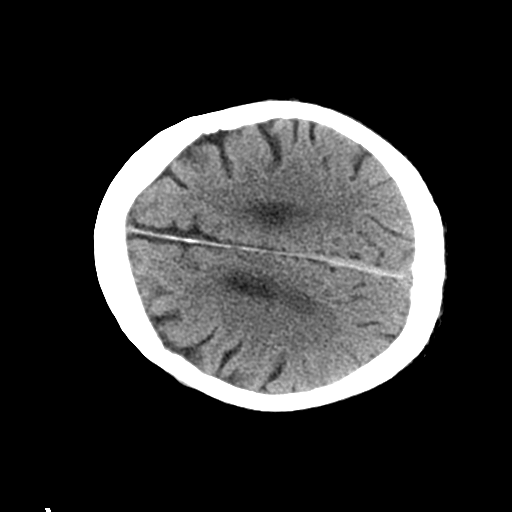
[im 24/29  brain]
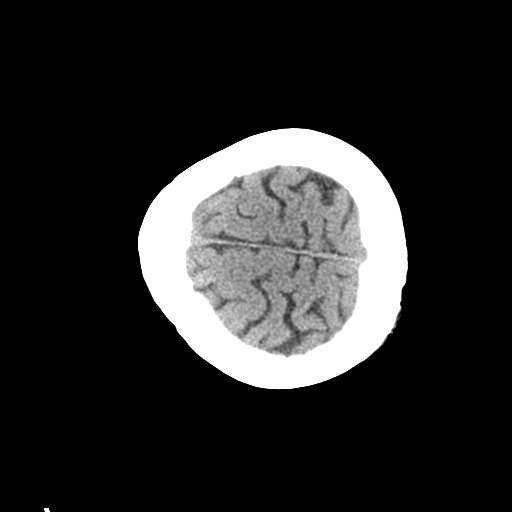
[im 24/29  bone]
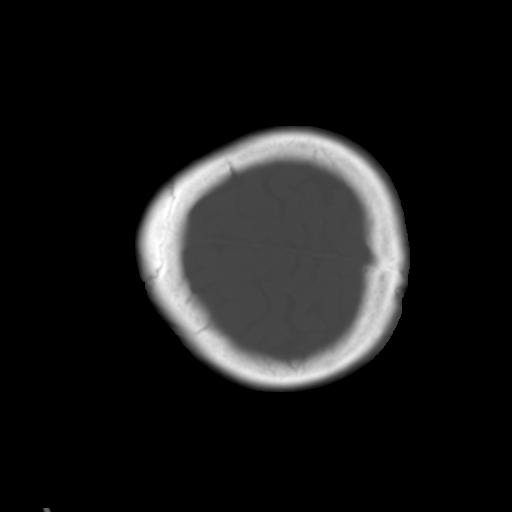

[Series 5: head wo · axial · 0.31mm/px · z∈[-124,-55]mm · 4 of 29 slices shown (2 of 2)]
[im 5/29  brain]
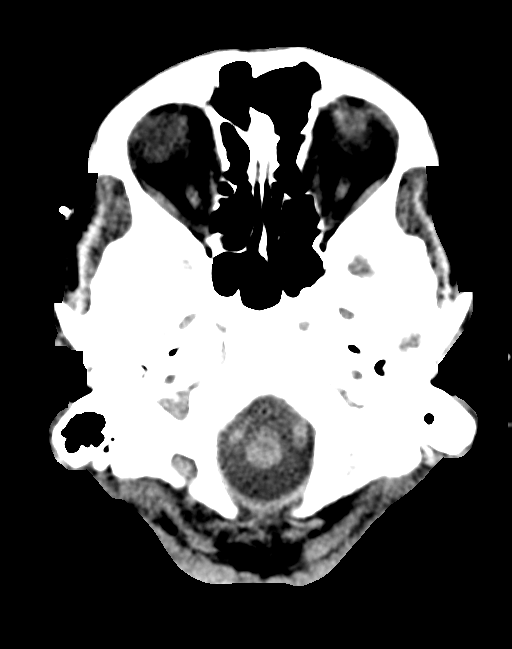
[im 10/29  brain]
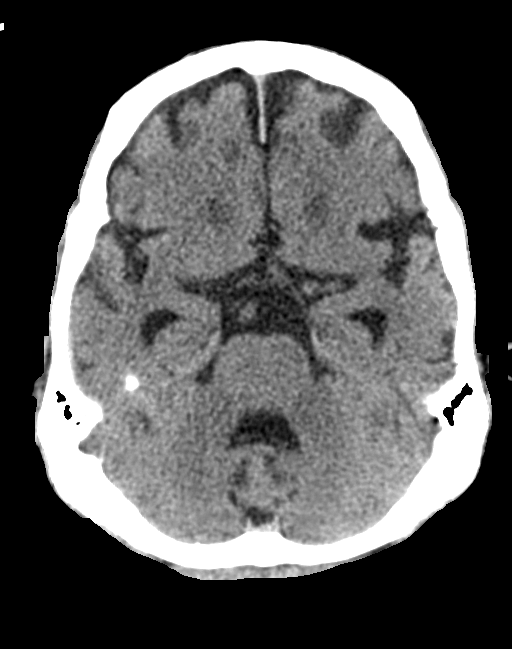
[im 15/29  brain]
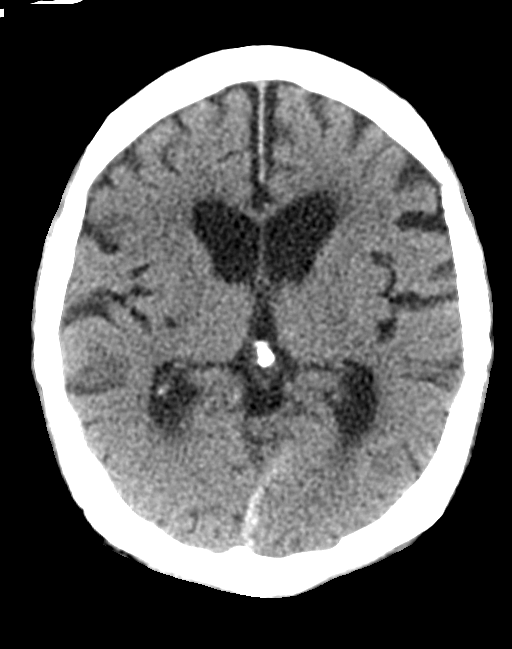
[im 19/29  brain]
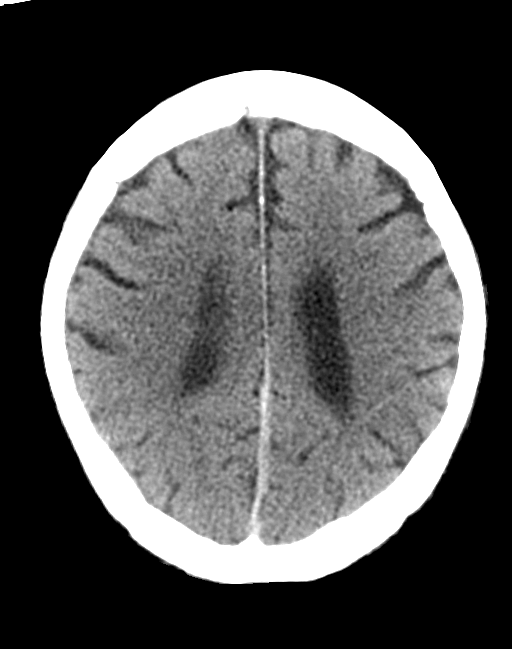

[Series 6: coronal soft tissue · coronal · 0.28mm/px · 3 of 56 slices shown]
[im 19/56  brain]
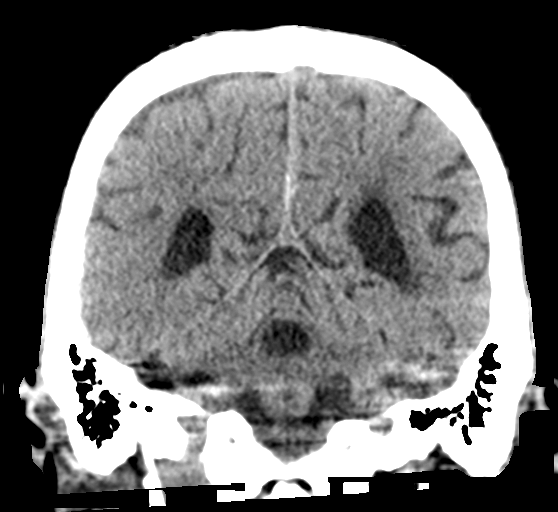
[im 25/56  brain]
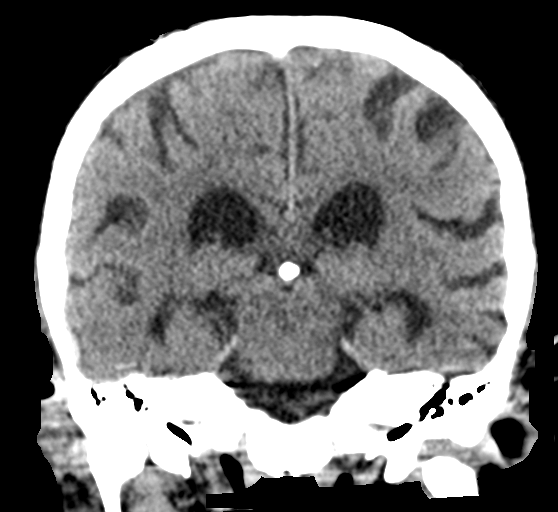
[im 31/56  brain]
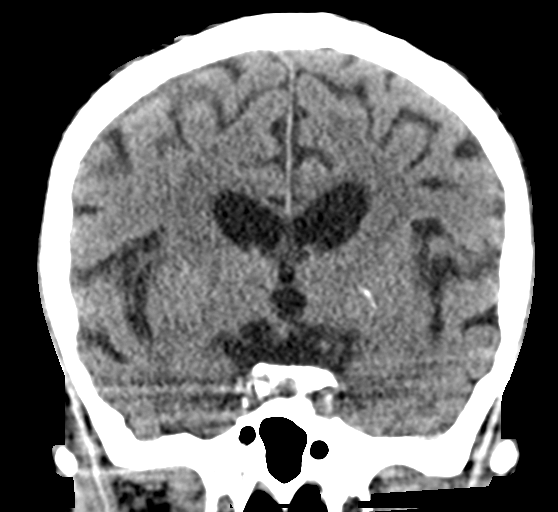

[Series 7: sagittal soft tissue · sagittal · 0.28mm/px · 3 of 50 slices shown]
[im 17/50  brain]
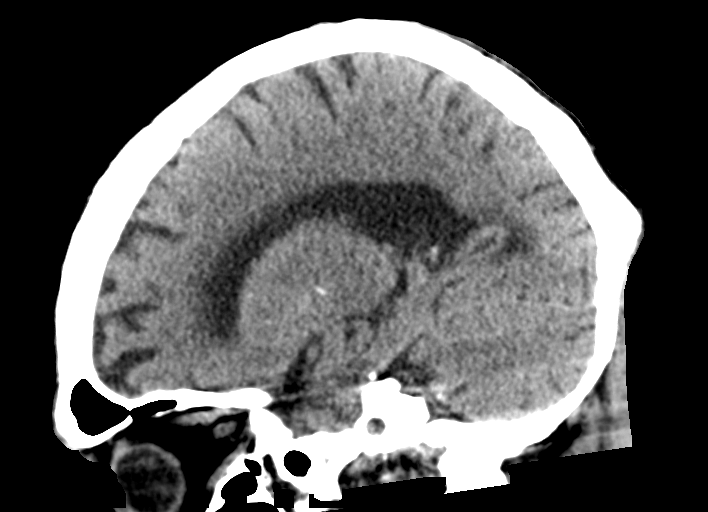
[im 25/50  brain]
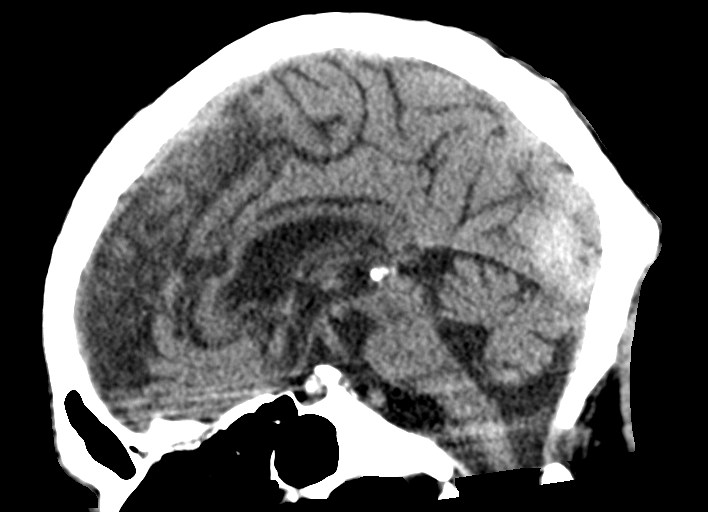
[im 33/50  brain]
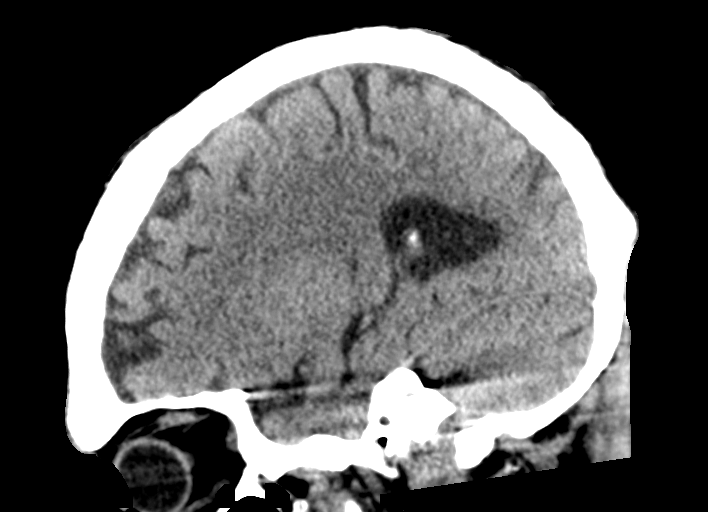

[15 of 47 positions shown; findings below may reference images not displayed]

FINDINGS: Brain: Small volume para falcine subdural hematoma has partially
regressed (series 3, image 19 today versus series 3, image 20
previously), bowel 3 millimeters thickness now. Mild extension to
the left tentorium as before (series 6, image 44).

No associated intracranial mass effect. No new intracranial
hemorrhage identified. Stable gray-white matter differentiation
throughout the brain.

No midline shift, ventriculomegaly, mass effect, evidence of mass
lesion, or evidence of cortically based acute infarction.

Vascular: Calcified atherosclerosis at the skull base.

Skull: Stable and intact.

Sinuses/Orbits: Visualized paranasal sinuses and mastoids are stable
and well pneumatized.

Other: Visualized orbits and scalp soft tissues are within normal
limits.
IMPRESSION: 1. Small volume para falcine subdural hematoma has slightly
decreased.
2. No associated intracranial mass effect, and no new acute
intracranial abnormality.
